# Patient Record
Sex: Female | Born: 1935 | Race: Black or African American | Hispanic: No | Marital: Single | Smoking: Never smoker
Health system: Southern US, Community
[De-identification: ages and names within clinical notes are randomized; demographics above are authoritative.]

## PROBLEM LIST (undated history)

## (undated) DIAGNOSIS — F419 Anxiety disorder, unspecified: Secondary | ICD-10-CM

## (undated) DIAGNOSIS — K219 Gastro-esophageal reflux disease without esophagitis: Secondary | ICD-10-CM

## (undated) DIAGNOSIS — M1990 Unspecified osteoarthritis, unspecified site: Secondary | ICD-10-CM

## (undated) DIAGNOSIS — I5030 Unspecified diastolic (congestive) heart failure: Secondary | ICD-10-CM

## (undated) DIAGNOSIS — I10 Essential (primary) hypertension: Secondary | ICD-10-CM

## (undated) DIAGNOSIS — E039 Hypothyroidism, unspecified: Secondary | ICD-10-CM

## (undated) DIAGNOSIS — I421 Obstructive hypertrophic cardiomyopathy: Secondary | ICD-10-CM

## (undated) DIAGNOSIS — R7303 Prediabetes: Secondary | ICD-10-CM

## (undated) DIAGNOSIS — G4730 Sleep apnea, unspecified: Secondary | ICD-10-CM

## (undated) DIAGNOSIS — E079 Disorder of thyroid, unspecified: Secondary | ICD-10-CM

## (undated) HISTORY — PX: TUBAL LIGATION: SHX77

---

## 2012-11-05 ENCOUNTER — Inpatient Hospital Stay (HOSPITAL_COMMUNITY): Payer: Medicare Other

## 2012-11-05 ENCOUNTER — Emergency Department (HOSPITAL_BASED_OUTPATIENT_CLINIC_OR_DEPARTMENT_OTHER): Payer: Medicare Other

## 2012-11-05 ENCOUNTER — Encounter (HOSPITAL_BASED_OUTPATIENT_CLINIC_OR_DEPARTMENT_OTHER): Payer: Self-pay | Admitting: *Deleted

## 2012-11-05 ENCOUNTER — Inpatient Hospital Stay (HOSPITAL_BASED_OUTPATIENT_CLINIC_OR_DEPARTMENT_OTHER)
Admission: EM | Admit: 2012-11-05 | Discharge: 2012-11-10 | DRG: 438 | Disposition: A | Discharge status: Home / Self Care | Payer: Medicare Other | Attending: Internal Medicine | Admitting: Internal Medicine

## 2012-11-05 DIAGNOSIS — R7309 Other abnormal glucose: Secondary | ICD-10-CM | POA: Diagnosis present

## 2012-11-05 DIAGNOSIS — E039 Hypothyroidism, unspecified: Secondary | ICD-10-CM

## 2012-11-05 DIAGNOSIS — I10 Essential (primary) hypertension: Secondary | ICD-10-CM

## 2012-11-05 DIAGNOSIS — E876 Hypokalemia: Secondary | ICD-10-CM | POA: Diagnosis present

## 2012-11-05 DIAGNOSIS — R6510 Systemic inflammatory response syndrome (SIRS) of non-infectious origin without acute organ dysfunction: Secondary | ICD-10-CM | POA: Diagnosis present

## 2012-11-05 DIAGNOSIS — F411 Generalized anxiety disorder: Secondary | ICD-10-CM | POA: Diagnosis present

## 2012-11-05 DIAGNOSIS — K219 Gastro-esophageal reflux disease without esophagitis: Secondary | ICD-10-CM | POA: Diagnosis present

## 2012-11-05 DIAGNOSIS — I509 Heart failure, unspecified: Secondary | ICD-10-CM

## 2012-11-05 DIAGNOSIS — I5033 Acute on chronic diastolic (congestive) heart failure: Secondary | ICD-10-CM

## 2012-11-05 DIAGNOSIS — I2510 Atherosclerotic heart disease of native coronary artery without angina pectoris: Secondary | ICD-10-CM | POA: Diagnosis present

## 2012-11-05 DIAGNOSIS — G4733 Obstructive sleep apnea (adult) (pediatric): Secondary | ICD-10-CM

## 2012-11-05 DIAGNOSIS — K859 Acute pancreatitis without necrosis or infection, unspecified: Principal | ICD-10-CM

## 2012-11-05 DIAGNOSIS — R9431 Abnormal electrocardiogram [ECG] [EKG]: Secondary | ICD-10-CM | POA: Diagnosis present

## 2012-11-05 DIAGNOSIS — I5031 Acute diastolic (congestive) heart failure: Secondary | ICD-10-CM

## 2012-11-05 DIAGNOSIS — I421 Obstructive hypertrophic cardiomyopathy: Secondary | ICD-10-CM

## 2012-11-05 DIAGNOSIS — M129 Arthropathy, unspecified: Secondary | ICD-10-CM | POA: Diagnosis present

## 2012-11-05 DIAGNOSIS — Z79899 Other long term (current) drug therapy: Secondary | ICD-10-CM

## 2012-11-05 DIAGNOSIS — Z7982 Long term (current) use of aspirin: Secondary | ICD-10-CM

## 2012-11-05 DIAGNOSIS — E8779 Other fluid overload: Secondary | ICD-10-CM | POA: Diagnosis present

## 2012-11-05 DIAGNOSIS — R1013 Epigastric pain: Secondary | ICD-10-CM

## 2012-11-05 HISTORY — DX: Unspecified osteoarthritis, unspecified site: M19.90

## 2012-11-05 HISTORY — DX: Gastro-esophageal reflux disease without esophagitis: K21.9

## 2012-11-05 HISTORY — DX: Hypothyroidism, unspecified: E03.9

## 2012-11-05 HISTORY — DX: Prediabetes: R73.03

## 2012-11-05 HISTORY — DX: Disorder of thyroid, unspecified: E07.9

## 2012-11-05 HISTORY — DX: Sleep apnea, unspecified: G47.30

## 2012-11-05 HISTORY — DX: Essential (primary) hypertension: I10

## 2012-11-05 HISTORY — DX: Anxiety disorder, unspecified: F41.9

## 2012-11-05 HISTORY — DX: Obstructive hypertrophic cardiomyopathy: I42.1

## 2012-11-05 LAB — CBC
HCT: 40.6 % (ref 36.0–46.0)
Hemoglobin: 14 g/dL (ref 12.0–15.0)
MCHC: 34.5 g/dL (ref 30.0–36.0)
MCV: 80.2 fL (ref 78.0–100.0)

## 2012-11-05 LAB — CBC WITH DIFFERENTIAL/PLATELET
Basophils Absolute: 0 10*3/uL (ref 0.0–0.1)
Eosinophils Absolute: 0 10*3/uL (ref 0.0–0.7)
Eosinophils Relative: 0 % (ref 0–5)
Lymphocytes Relative: 16 % (ref 12–46)
Lymphs Abs: 1.8 10*3/uL (ref 0.7–4.0)
MCH: 27.3 pg (ref 26.0–34.0)
MCV: 79.2 fL (ref 78.0–100.0)
Neutrophils Relative %: 79 % — ABNORMAL HIGH (ref 43–77)
Platelets: 224 10*3/uL (ref 150–400)
RBC: 5.68 MIL/uL — ABNORMAL HIGH (ref 3.87–5.11)
RDW: 14.9 % (ref 11.5–15.5)
WBC: 11.4 10*3/uL — ABNORMAL HIGH (ref 4.0–10.5)

## 2012-11-05 LAB — URINALYSIS, ROUTINE W REFLEX MICROSCOPIC
Bilirubin Urine: NEGATIVE
Glucose, UA: NEGATIVE mg/dL
Specific Gravity, Urine: 1.022 (ref 1.005–1.030)
pH: 5.5 (ref 5.0–8.0)

## 2012-11-05 LAB — COMPREHENSIVE METABOLIC PANEL
ALT: 37 U/L — ABNORMAL HIGH (ref 0–35)
AST: 49 U/L — ABNORMAL HIGH (ref 0–37)
Alkaline Phosphatase: 91 U/L (ref 39–117)
Calcium: 9.5 mg/dL (ref 8.4–10.5)
Potassium: 3.7 mEq/L (ref 3.5–5.1)
Sodium: 138 mEq/L (ref 135–145)
Total Protein: 8.1 g/dL (ref 6.0–8.3)

## 2012-11-05 LAB — CREATININE, SERUM: GFR calc non Af Amer: 55 mL/min — ABNORMAL LOW (ref >90)

## 2012-11-05 LAB — URINE MICROSCOPIC-ADD ON

## 2012-11-05 LAB — TROPONIN I
Troponin I: <0.3 ng/mL (ref <0.30)
Troponin I: <0.3 ng/mL (ref <0.30)

## 2012-11-05 MED ORDER — ONDANSETRON HCL 4 MG PO TABS: 4.0000 mg | ORAL_TABLET | Freq: Four times a day (QID) | ORAL | Status: DC | PRN

## 2012-11-05 MED ORDER — IOHEXOL 300 MG/ML  SOLN
50.0000 mL | Freq: Once | INTRAMUSCULAR | Status: AC | PRN
  Administered 2012-11-05: 50 mL via ORAL

## 2012-11-05 MED ORDER — DILTIAZEM HCL ER BEADS 180 MG PO CP24: 180.0000 mg | ORAL_CAPSULE | Freq: Every day | ORAL | Status: DC

## 2012-11-05 MED ORDER — ENOXAPARIN SODIUM 40 MG/0.4ML ~~LOC~~ SOLN
40.0000 mg | SUBCUTANEOUS | Status: DC
  Administered 2012-11-05 – 2012-11-09 (×5): 40 mg via SUBCUTANEOUS
  Filled 2012-11-05 (×6): qty 0.4

## 2012-11-05 MED ORDER — ASPIRIN 81 MG PO TABS: 81.0000 mg | ORAL_TABLET | Freq: Every day | ORAL | Status: DC

## 2012-11-05 MED ORDER — LEVOTHYROXINE SODIUM 25 MCG PO TABS
25.0000 ug | ORAL_TABLET | Freq: Every day | ORAL | Status: DC
  Administered 2012-11-06 – 2012-11-10 (×5): 25 ug via ORAL
  Filled 2012-11-05 (×6): qty 1

## 2012-11-05 MED ORDER — IOHEXOL 300 MG/ML  SOLN
100.0000 mL | Freq: Once | INTRAMUSCULAR | Status: AC | PRN
  Administered 2012-11-05: 100 mL via INTRAVENOUS

## 2012-11-05 MED ORDER — SODIUM CHLORIDE 0.9 % IV BOLUS (SEPSIS)
500.0000 mL | Freq: Once | INTRAVENOUS | Status: AC
  Administered 2012-11-05: 500 mL via INTRAVENOUS

## 2012-11-05 MED ORDER — ONDANSETRON HCL 4 MG/2ML IJ SOLN: 4.0000 mg | Freq: Three times a day (TID) | INTRAMUSCULAR | Status: DC | PRN

## 2012-11-05 MED ORDER — ALUM & MAG HYDROXIDE-SIMETH 200-200-20 MG/5ML PO SUSP: 30.0000 mL | Freq: Four times a day (QID) | ORAL | Status: DC | PRN

## 2012-11-05 MED ORDER — ONDANSETRON HCL 4 MG/2ML IJ SOLN
4.0000 mg | Freq: Once | INTRAMUSCULAR | Status: AC
  Administered 2012-11-05: 4 mg via INTRAVENOUS
  Filled 2012-11-05: qty 2

## 2012-11-05 MED ORDER — ALPRAZOLAM 0.5 MG PO TABS
0.5000 mg | ORAL_TABLET | Freq: Two times a day (BID) | ORAL | Status: DC
  Administered 2012-11-05: 0.5 mg via ORAL
  Filled 2012-11-05: qty 1

## 2012-11-05 MED ORDER — METOPROLOL SUCCINATE ER 100 MG PO TB24
100.0000 mg | ORAL_TABLET | Freq: Every day | ORAL | Status: DC
  Administered 2012-11-05: 100 mg via ORAL
  Filled 2012-11-05 (×2): qty 1

## 2012-11-05 MED ORDER — HYDROCODONE-ACETAMINOPHEN 5-325 MG PO TABS: 1.0000 | ORAL_TABLET | ORAL | Status: DC | PRN

## 2012-11-05 MED ORDER — ACETAMINOPHEN 325 MG PO TABS: 650.0000 mg | ORAL_TABLET | Freq: Four times a day (QID) | ORAL | Status: DC | PRN

## 2012-11-05 MED ORDER — DILTIAZEM HCL ER COATED BEADS 180 MG PO CP24
180.0000 mg | ORAL_CAPSULE | Freq: Every day | ORAL | Status: DC
  Administered 2012-11-05 – 2012-11-10 (×5): 180 mg via ORAL
  Filled 2012-11-05 (×7): qty 1

## 2012-11-05 MED ORDER — DICYCLOMINE HCL 10 MG PO CAPS
10.0000 mg | ORAL_CAPSULE | Freq: Two times a day (BID) | ORAL | Status: DC
  Administered 2012-11-05 – 2012-11-07 (×2): 10 mg via ORAL
  Filled 2012-11-05 (×5): qty 1

## 2012-11-05 MED ORDER — SODIUM CHLORIDE 0.9 % IV SOLN
INTRAVENOUS | Status: DC
  Administered 2012-11-05 – 2012-11-06 (×3): via INTRAVENOUS

## 2012-11-05 MED ORDER — ONDANSETRON HCL 4 MG/2ML IJ SOLN
4.0000 mg | Freq: Four times a day (QID) | INTRAMUSCULAR | Status: DC | PRN
  Administered 2012-11-05 – 2012-11-06 (×2): 4 mg via INTRAVENOUS
  Filled 2012-11-05 (×2): qty 2

## 2012-11-05 MED ORDER — ASPIRIN 81 MG PO CHEW
81.0000 mg | CHEWABLE_TABLET | Freq: Every day | ORAL | Status: DC
  Administered 2012-11-05 – 2012-11-10 (×5): 81 mg via ORAL
  Filled 2012-11-05 (×7): qty 1

## 2012-11-05 MED ORDER — PANTOPRAZOLE SODIUM 40 MG IV SOLR
40.0000 mg | Freq: Two times a day (BID) | INTRAVENOUS | Status: DC
  Administered 2012-11-05 – 2012-11-06 (×3): 40 mg via INTRAVENOUS
  Filled 2012-11-05 (×5): qty 40

## 2012-11-05 MED ORDER — ACETAMINOPHEN 650 MG RE SUPP: 650.0000 mg | Freq: Four times a day (QID) | RECTAL | Status: DC | PRN

## 2012-11-05 MED ORDER — DEXTROSE-NACL 5-0.45 % IV SOLN
INTRAVENOUS | Status: AC
  Administered 2012-11-05: 16:00:00 via INTRAVENOUS

## 2012-11-05 MED ORDER — HYDROMORPHONE HCL PF 1 MG/ML IJ SOLN
1.0000 mg | INTRAMUSCULAR | Status: DC | PRN
  Administered 2012-11-05: 1 mg via INTRAVENOUS
  Filled 2012-11-05: qty 1

## 2012-11-05 NOTE — Progress Notes (Signed)
ZORIANA OATS [id number] Admission Data: 11/05/2012 4:29 PM Attending Provider: Cathren Harsh, MD  PCP:No primary provider on file. Consults/ Treatment Team:    Elizabeth Shields is a 79 y.o. female patient admitted from [facility] with pancreatitis. awake, alert  & orientated  X 3, independent No Order, VSS - Blood pressure 136/64, pulse 108, temperature 98.6 F (37 C), temperature source Oral, resp. rate 16, height 5\' 3"  (1.6 m), weight 78.019 kg (172 lb), SpO2 93.00%., , no c/o shortness of breath, no c/o chest pain, no distress noted.  IV site WDL:  antecubital left, condition patent and no redness with a transparent dsg that's clean dry and intact.  Allergies:   Allergies  Allergen Reactions  . Sulfa Antibiotics Nausea And Vomiting     Past Medical History  Diagnosis Date  . Hypertension   . Thyroid disease   . Coronary artery disease   . Arthritis     History:  obtained from the patient.  Pt orientation to unit, room and routine. Information packet given to patient/family and safety video watched.  Admission INP armband ID verified with patient/family, and in place. SR up x 2, fall risk assessment complete with Patient and family verbalizing understanding of risks associated with falls. Pt verbalizes an understanding of how to use the call bell and to call for help before getting out of bed.  Skin, clean-dry- intact without evidence of bruising, or skin tears.   No evidence of skin break down noted on exam.     Will cont to monitor and assist as needed.  Cindra Eves, RN 11/05/2012 4:29 PM

## 2012-11-05 NOTE — H&P (Signed)
History and Physical       Hospital Admission Note Date: 11/05/2012  Patient name: Elizabeth Shields Medical record number: [id number] Date of birth: [birth year] Age: 79 y.o. Gender: female PCP: Dr Meredeth Ide in [place]    Chief Complaint:  Abdominal pain for last 2 days  HPI: Patient is a 79 year old female with history of hypertension, hypothyroidism, arthritis presented to [facility] ED with abdominal pain. History was obtained from the patient and her family present in the room. Patient reported having epigastric pain which started yesterday, radiating to right upper quadrant and left upper quadrant, 8/10, sharp with nausea. She did not have any vomiting however had to try eating and unable to hold anything down. She did have constipation but last bowel movement was today which did not improve her pain. ED workup showed lipase more than 3000, CT of the abdomen and pelvis showing possibly acute pancreatitis with apparent inflammatory process in right upper quadrant of the abdomen with ascites and soft tissue edema surrounding the proximal duodenum and right colon.   Review of Systems:  Constitutional: Denies diaphoresis,+ poor appetite and fatigue, + subjective fever and chills.  HEENT: Denies photophobia, eye pain, redness, hearing loss, ear pain, congestion, sore throat, rhinorrhea, sneezing, mouth sores, trouble swallowing, neck pain, neck stiffness and tinnitus.   Respiratory: Denies SOB, DOE, cough, chest tightness,  and wheezing.   Cardiovascular: Denies chest pain, palpitations and leg swelling.  Gastrointestinal: Please see history of present illness Genitourinary: Denies dysuria, urgency, frequency, hematuria, flank pain and difficulty urinating.  Musculoskeletal: Denies myalgias, back pain, joint swelling, arthralgias and gait problem.  Skin: Denies pallor, rash and wound.  Neurological: Denies dizziness, seizures,  syncope, weakness, light-headedness, numbness and headaches.  Hematological: Denies adenopathy. Easy bruising, personal or family bleeding history  Psychiatric/Behavioral: Denies suicidal ideation, mood changes, confusion, nervousness, sleep disturbance and agitation  Past Medical History: Past Medical History  Diagnosis Date  . Hypertension   . Thyroid disease   . Coronary artery disease   . Arthritis    History reviewed. No pertinent past surgical history.  Medications: Prior to Admission medications   Medication Sig Start Date End Date Taking? Authorizing Provider  acetaminophen (TYLENOL) 500 MG tablet Take 500 mg by mouth every 6 (six) hours as needed for pain.   Yes Historical Provider, MD  ALPRAZolam (XANAX) 0.5 MG tablet 0.5 mg 2 (two) times daily.   Yes Historical Provider, MD  aspirin 81 MG tablet Take 81 mg by mouth daily.   Yes Historical Provider, MD  Calcium Carbonate-Vitamin D (CALTRATE 600+D) 600-400 MG-UNIT per tablet Take 1 tablet by mouth 2 (two) times daily.   Yes Historical Provider, MD  dicyclomine (BENTYL) 10 MG capsule Take 10 mg by mouth 2 (two) times daily.   Yes Historical Provider, MD  diltiazem (TIAZAC) 180 MG 24 hr capsule Take 180 mg by mouth daily.   Yes Historical Provider, MD  levothyroxine (SYNTHROID, LEVOTHROID) 25 MCG tablet Take 25 mcg by mouth daily before breakfast.   Yes Historical Provider, MD  metoprolol succinate (TOPROL-XL) 100 MG 24 hr tablet Take 100 mg by mouth daily. Take with or immediately following a meal.   Yes Historical Provider, MD  Multiple Vitamins-Calcium (ONE-A-DAY WOMENS FORMULA PO) Take 1 tablet by mouth daily.   Yes Historical Provider, MD  pantoprazole (PROTONIX) 40 MG tablet Take 40 mg by mouth daily.   Yes Historical Provider, MD  pravastatin (PRAVACHOL) 10 MG tablet Take 10 mg by mouth daily.  Yes Historical Provider, MD  raloxifene (EVISTA) 60 MG tablet Take 60 mg by mouth daily.   Yes Historical Provider, MD     Allergies:   Allergies  Allergen Reactions  . Sulfa Antibiotics Nausea And Vomiting    Social History:  reports that she has never smoked. She does not have any smokeless tobacco history on file. She reports that she does not drink alcohol or use illicit drugs. she currently lives at home and is functional with ADLs  Family History: 1sister had breast cancer, one of her brothers had liver cancer  Physical Exam: Blood pressure 145/83, pulse 108, temperature 98.6 F (37 C), temperature source Oral, resp. rate 18, height 5\' 3"  (1.6 m), weight 78.019 kg (172 lb), SpO2 96.00%. General: Alert, awake, oriented x3, in no acute distress. HEENT: normocephalic, atraumatic, anicteric sclera, pink conjunctiva, pupils equal and reactive to light and accomodation, oropharynx clear Neck: supple, no masses or lymphadenopathy, no goiter, no bruits  Heart: Regular rate and rhythm, without murmurs, rubs or gallops. Lungs: Clear to auscultation bilaterally, no wheezing, rales or rhonchi. Abdomen: Soft, tender diffusely but worse in epigastric, RUQ, LUQ nondistended, positive bowel sounds. Extremities: No clubbing, cyanosis or edema with positive pedal pulses. Neuro: Grossly intact, no focal neurological deficits, strength 5/5 upper and lower extremities bilaterally Psych: alert and oriented x 3, normal mood and affect Skin: no rashes or lesions, warm and dry   LABS on Admission:  Basic Metabolic Panel:  Recent Labs Lab 11/05/12 1015  NA 138  K 3.7  CL 101  CO2 23  GLUCOSE 133*  BUN 14  CREATININE 0.90  CALCIUM 9.5   Liver Function Tests:  Recent Labs Lab 11/05/12 1015  AST 49*  ALT 37*  ALKPHOS 91  BILITOT 0.8  PROT 8.1  ALBUMIN 3.9    Recent Labs Lab 11/05/12 1015  LIPASE >3000*   No results found for this basename: AMMONIA,  in the last 168 hours CBC:  Recent Labs Lab 11/05/12 1015  WBC 11.4*  NEUTROABS 9.0*  HGB 15.5*  HCT 45.0  MCV 79.2  PLT 224    Cardiac Enzymes:  Recent Labs Lab 11/05/12 1021  TROPONINI <0.30   BNP: No components found with this basename: POCBNP,  CBG: No results found for this basename: GLUCAP,  in the last 168 hours   Radiological Exams on Admission: Ct Abdomen Pelvis W Contrast  11/05/2012  **ADDENDUM** CREATED: 11/05/2012 13:30:01  In further discussion with Dr. Rubin Payor and in light of a significantly elevated serum lipase level, these findings are probably secondary to acute pancreatitis.  **END ADDENDUM** SIGNED BY: Gerrianne Scale, M.D.   11/05/2012  *  IMPRESSION:  1.  There is an apparent inflammatory process in the right upper quadrant of the abdomen with ascites and soft tissue edema surrounding the proximal duodenum and right colon. The source for this is not clearly demonstrated and is not felt to be related to the gallbladder, pancreas or appendix. 2.  No evidence of extravasated enteric contrast, bowel obstruction or drainable abscess. 3.  No significant solid parenchymal organ findings. 4.  Small right pleural effusion and right lower lobe atelectasis.   Original Report Authenticated By: Carey Bullocks, M.D.    Dg Abd Acute W/chest  11/05/2012  *RADIOLOGY REPORT*  Clinical Data: Abdominal pain with nausea since yesterday.  ACUTE ABDOMEN SERIES (ABDOMEN 2 VIEW & CHEST 1 VIEW)  Comparison: None.  Findings: There are low lung volumes with patchy right greater than left basilar  pulmonary opacities.  No significant pleural effusion is identified.  Heart size and mediastinal contours are normal. Scattered osteophytes of the thoracic spine are noted.  The bowel gas pattern is normal.  There is no free intraperitoneal air.  There are no suspicious calcifications.  Small pelvic calcifications on the left are probably phleboliths.  There is a mild convex right lumbar scoliosis.  IMPRESSION:  1.  No acute abdominal findings identified. 2.  Low lung volumes with probable bibasilar atelectasis.   Original Report  Authenticated By: Carey Bullocks, M.D.     Assessment/Plan Principal Problem:   Pancreatitis, acute with abdominal pain epigastric, RUQ, LUQ: Unclear etiology, possibly gallstones versus medication effect (statins), no history of alcohol. LFTs mildly up. - Admit to telemetry, will obtain RUQ abdominal ultrasound, place on n.p.o. status - IV fluid hydration, pain control, antiemetics, IV protonix - CMET, lipase in AM - hold statins, no prior episode of pancreatitis before.  Active Problems:   HTN (hypertension) - Continue Cardizem, beta blocker   Nonspecific changes on the EKG: rate 99, NSR, LVH with repolarization changes vs ST depressions. No prior EKG to compare with. Tachycardia could be due to acute pancreatitis or due to BB/CCB withdrawal as patient has not taken any of her long acting antihypertensives in last 24hours. She does not have any chest pain or shortness of breath.  - Placed on telemetry, rule out acute ACS,  First set of troponins negative, check ECHO - continue aspirin, beta blocker, CCB   Hypothyroidism: Continue Synthroid, check TSH  DVT prophylaxis: Lovenox  CODE STATUS: Full code (addressed with the patient)  Further plan will depend as patient's clinical course evolves and further radiologic and laboratory data become available.   Time Spent on Admission: 1 hour  Yue Flanigan M.D. [facility] 11/05/2012, 4:48 PM Pager: [phone]  If 7PM-7AM, please contact night-coverage [email or web address] Password [email or web address]

## 2012-11-05 NOTE — ED Notes (Signed)
Abdominal pain and nausea onset yesterday states stomach is sore from "gagging"

## 2012-11-05 NOTE — ED Notes (Signed)
Patient transported to & from xray. 

## 2012-11-05 NOTE — Progress Notes (Signed)
Received call from DR Rubin Payor to accept for admission Elizabeth Shields for direct admission. Patient presents with nausea, abdominal pain. Lipase more than 3000. No history of alcohol. CT show inflammation RUQ.Marland Kitchen Please see full report. Patient per Dr Rubin Payor stable for transfer. Regular bed.  Abshir Paolini. Md.

## 2012-11-05 NOTE — ED Provider Notes (Signed)
History     CSN: [id number]  Arrival date & time 11/05/12  0907   First MD Initiated Contact with Patient 11/05/12 0935      Chief Complaint  Patient presents with  . Abdominal Pain    (Consider location/radiation/quality/duration/timing/severity/associated sxs/prior treatment) Patient is a 79 y.o. female presenting with abdominal pain. The history is provided by the patient.  Abdominal Pain Associated symptoms: fatigue and nausea   Associated symptoms: no chest pain, no diarrhea, no shortness of breath and no vomiting    Patient's had upper abdominal pain that then went to generalized abdominal pain. Again a couple days ago. She's had nausea without vomiting. She has had constipation for the last 2 days. No fevers. She's had a decreased appetite. No chest pain. No cough. No dysuria. She's had no previous abdominal surgery. States she feels bad all over. No sore throat. No headache. No Confusion. No clear sick contacts. The pain is dull and constant. There is some cramping. Past Medical History  Diagnosis Date  . Hypertension   . Thyroid disease   . Coronary artery disease   . Arthritis     History reviewed. No pertinent past surgical history.  History reviewed. No pertinent family history.  History  Substance Use Topics  . Smoking status: Never Smoker   . Smokeless tobacco: Not on file  . Alcohol Use: No    OB History   Grav Para Term Preterm Abortions TAB SAB Ect Mult Living                  Review of Systems  Constitutional: Positive for appetite change and fatigue. Negative for activity change.  HENT: Negative for neck stiffness.   Eyes: Negative for pain.  Respiratory: Negative for chest tightness and shortness of breath.   Cardiovascular: Negative for chest pain and leg swelling.  Gastrointestinal: Positive for nausea and abdominal pain. Negative for vomiting and diarrhea.  Genitourinary: Negative for flank pain.  Musculoskeletal: Negative for back pain.   Skin: Negative for rash.  Neurological: Negative for weakness, numbness and headaches.  Psychiatric/Behavioral: Negative for behavioral problems.    Allergies  Sulfa antibiotics  Home Medications   Current Outpatient Rx  Name  Route  Sig  Dispense  Refill  . acetaminophen (TYLENOL) 500 MG tablet   Oral   Take 500 mg by mouth every 6 (six) hours as needed for pain.         Marland Kitchen ALPRAZolam (XANAX) 0.5 MG tablet      0.5 mg 2 (two) times daily.         Marland Kitchen aspirin 81 MG tablet   Oral   Take 81 mg by mouth daily.         . Calcium Carbonate-Vitamin D (CALTRATE 600+D) 600-400 MG-UNIT per tablet   Oral   Take 1 tablet by mouth 2 (two) times daily.         Marland Kitchen dicyclomine (BENTYL) 10 MG capsule   Oral   Take 10 mg by mouth 2 (two) times daily.         Marland Kitchen diltiazem (TIAZAC) 180 MG 24 hr capsule   Oral   Take 180 mg by mouth daily.         Marland Kitchen levothyroxine (SYNTHROID, LEVOTHROID) 25 MCG tablet   Oral   Take 25 mcg by mouth daily before breakfast.         . metoprolol succinate (TOPROL-XL) 100 MG 24 hr tablet   Oral   Take 100  mg by mouth daily. Take with or immediately following a meal.         . Multiple Vitamins-Calcium (ONE-A-DAY WOMENS FORMULA PO)   Oral   Take 1 tablet by mouth daily.         . pantoprazole (PROTONIX) 40 MG tablet   Oral   Take 40 mg by mouth daily.         . pravastatin (PRAVACHOL) 10 MG tablet   Oral   Take 10 mg by mouth daily.         . raloxifene (EVISTA) 60 MG tablet   Oral   Take 60 mg by mouth daily.           BP 136/64  Pulse 108  Temp(Src) 98.6 F (37 C) (Oral)  Resp 16  Ht 5\' 3"  (1.6 m)  Wt 172 lb (78.019 kg)  BMI 30.48 kg/m2  SpO2 93%  Physical Exam  Nursing note and vitals reviewed. Constitutional: She is oriented to person, place, and time. She appears well-developed and well-nourished.  HENT:  Head: Normocephalic and atraumatic.  Eyes: EOM are normal. Pupils are equal, round, and reactive to  light.  Neck: Normal range of motion. Neck supple.  Cardiovascular: Regular rhythm and normal heart sounds.   No murmur heard. Mild tachycardia  Pulmonary/Chest: Effort normal and breath sounds normal. No respiratory distress. She has no wheezes. She has no rales.  Abdominal: Soft. Bowel sounds are normal. She exhibits no distension. There is tenderness. There is no rebound and no guarding.  Mild diffuse tenderness without rebound or guarding. No palpated masses  Musculoskeletal: Normal range of motion.  Neurological: She is alert and oriented to person, place, and time. No cranial nerve deficit.  Skin: Skin is warm and dry.  Psychiatric: She has a normal mood and affect. Her speech is normal.    ED Course  Procedures (including critical care time)  Labs Reviewed  CBC WITH DIFFERENTIAL - Abnormal; Notable for the following:    WBC 11.4 (*)    RBC 5.68 (*)    Hemoglobin 15.5 (*)    Neutrophils Relative 79 (*)    Neutro Abs 9.0 (*)    All other components within normal limits  COMPREHENSIVE METABOLIC PANEL - Abnormal; Notable for the following:    Glucose, Bld 133 (*)    AST 49 (*)    ALT 37 (*)    GFR calc non Af Amer 60 (*)    GFR calc Af Amer 69 (*)    All other components within normal limits  LIPASE, BLOOD - Abnormal; Notable for the following:    Lipase >3000 (*)    All other components within normal limits  URINALYSIS, ROUTINE W REFLEX MICROSCOPIC - Abnormal; Notable for the following:    Color, Urine AMBER (*)    Hgb urine dipstick TRACE (*)    Ketones, ur 15 (*)    Protein, ur 30 (*)    All other components within normal limits  URINE MICROSCOPIC-ADD ON - Abnormal; Notable for the following:    Bacteria, UA FEW (*)    Casts GRANULAR CAST (*)    All other components within normal limits  TROPONIN I   Ct Abdomen Pelvis W Contrast  11/05/2012  **ADDENDUM** CREATED: 11/05/2012 13:30:01  In further discussion with Dr. Rubin Payor and in light of a significantly elevated  serum lipase level, these findings are probably secondary to acute pancreatitis.  **END ADDENDUM** SIGNED BY: Gerrianne Scale, M.D.   11/05/2012  *  RADIOLOGY REPORT*  Clinical Data: Diffuse abdominal pain with nausea and slight diarrhea.  CT ABDOMEN AND PELVIS WITH CONTRAST  Technique:  Multidetector CT imaging of the abdomen and pelvis was performed following the standard protocol during bolus administration of intravenous contrast.  Contrast: 50mL OMNIPAQUE IOHEXOL 300 MG/ML  SOLN, OMNIPAQUE IOHEXOL 300 MG/ML  SOLN  Comparison: Acute abdominal series same date.  Findings: There is a small right pleural effusion with associated right lower lobe atelectasis.  The lung bases are otherwise clear. Faint coronary calcifications are noted.  There is a moderate amount of perihepatic ascites.  Fluid tracks along the right paracolic gutter into the pelvis.  There is minimal fluid surrounding the gallbladder, and no significant gallbladder wall thickening.  Edema surrounds the proximal duodenum.  There is no duodenal or colonic wall thickening.  There is no extravasated enteric contrast.  The pancreas demonstrates normal density and enhancement.  There is no peripancreatic fluid collection or biliary dilatation.  There are several well circumscribed low density hepatic lesions most consistent with cysts.  The largest of these is in the lateral segment left hepatic lobe, measuring 2.5 cm on image 19.  The spleen and adrenal glands appear normal.  There are tiny low density right renal lesions.  Although there is mildly asymmetric perinephric soft tissue stranding on the right, no primary renal process is identified.  The stomach is incompletely distended.  The small bowel and colon within the pelvis appear normal.  The cecum is situated within the right mid abdomen.  The appendix demonstrates no definite inflammation.  There is mild aorto iliac atherosclerosis.  The bladder, uterus and ovaries appear normal.  The lower  lumbar spine degenerative changes are present with a grade 1 anterolisthesis at L3-L4 and L4-L5.  No acute osseous findings are seen.  IMPRESSION:  1.  There is an apparent inflammatory process in the right upper quadrant of the abdomen with ascites and soft tissue edema surrounding the proximal duodenum and right colon. The source for this is not clearly demonstrated and is not felt to be related to the gallbladder, pancreas or appendix. 2.  No evidence of extravasated enteric contrast, bowel obstruction or drainable abscess. 3.  No significant solid parenchymal organ findings. 4.  Small right pleural effusion and right lower lobe atelectasis.   Original Report Authenticated By: Carey Bullocks, M.D.    Dg Abd Acute W/chest  11/05/2012  *RADIOLOGY REPORT*  Clinical Data: Abdominal pain with nausea since yesterday.  ACUTE ABDOMEN SERIES (ABDOMEN 2 VIEW & CHEST 1 VIEW)  Comparison: None.  Findings: There are low lung volumes with patchy right greater than left basilar pulmonary opacities.  No significant pleural effusion is identified.  Heart size and mediastinal contours are normal. Scattered osteophytes of the thoracic spine are noted.  The bowel gas pattern is normal.  There is no free intraperitoneal air.  There are no suspicious calcifications.  Small pelvic calcifications on the left are probably phleboliths.  There is a mild convex right lumbar scoliosis.  IMPRESSION:  1.  No acute abdominal findings identified. 2.  Low lung volumes with probable bibasilar atelectasis.   Original Report Authenticated By: Carey Bullocks, M.D.      1. Pancreatitis     Date: 11/05/2012  Rate: 78Rhythm: normal sinus rhythm  QRS Axis: normal  Intervals: normal  ST/T Wave abnormalities: nonspecific ST/T changes  Conduction Disutrbances:none  Narrative Interpretation: Nonspecific ST and T wave changes. LVH with likely repol  Old EKG Reviewed: none  available    MDM  Patient presents with nausea vomiting abdominal  pain. Lab work shows a pancreatitis. CT done and shows likely pancreatitis. Patient be admitted to internal medicine        Juliet Rude. Rubin Payor, MD 11/05/12 [phone]

## 2012-11-05 NOTE — Progress Notes (Deleted)
CRITICAL VALUE ALERT  Critical value received:  Positive blood culture Aerobic Yeast  Date of notification:  11/05/2012  Time of notification: 2205  Critical value read back:yes  Nurse who received alert:  Marcelyn Bruins  MD notified (1st page):  Jacobo Forest  Time of first page:  2206  Responding MD:  Jacobo Forest  Time MD responded:  On flood during time of call  Bascom Levels RN BSN

## 2012-11-06 ENCOUNTER — Inpatient Hospital Stay (HOSPITAL_COMMUNITY): Payer: Medicare Other

## 2012-11-06 DIAGNOSIS — E039 Hypothyroidism, unspecified: Secondary | ICD-10-CM | POA: Diagnosis present

## 2012-11-06 LAB — COMPREHENSIVE METABOLIC PANEL
CO2: 22 mEq/L (ref 19–32)
Calcium: 7.7 mg/dL — ABNORMAL LOW (ref 8.4–10.5)
Creatinine, Ser: 1.36 mg/dL — ABNORMAL HIGH (ref 0.50–1.10)
GFR calc Af Amer: 42 mL/min — ABNORMAL LOW (ref >90)
GFR calc non Af Amer: 36 mL/min — ABNORMAL LOW (ref >90)
Glucose, Bld: 97 mg/dL (ref 70–99)

## 2012-11-06 LAB — CBC
Hemoglobin: 12.6 g/dL (ref 12.0–15.0)
MCH: 27.3 pg (ref 26.0–34.0)
MCV: 80.5 fL (ref 78.0–100.0)
Platelets: 200 10*3/uL (ref 150–400)
RBC: 4.62 MIL/uL (ref 3.87–5.11)

## 2012-11-06 LAB — TSH: TSH: 5.204 u[IU]/mL — ABNORMAL HIGH (ref 0.350–4.500)

## 2012-11-06 LAB — LIPID PANEL: Cholesterol: 134 mg/dL (ref 0–200)

## 2012-11-06 MED ORDER — IMIPENEM-CILASTATIN 500 MG IV SOLR: 500.0000 mg | Freq: Three times a day (TID) | INTRAVENOUS | Status: DC

## 2012-11-06 MED ORDER — SODIUM CHLORIDE 0.9 % IV SOLN
INTRAVENOUS | Status: DC
  Administered 2012-11-06: 17:00:00 via INTRAVENOUS

## 2012-11-06 MED ORDER — HYDROMORPHONE HCL PF 1 MG/ML IJ SOLN
1.0000 mg | INTRAMUSCULAR | Status: DC | PRN
  Administered 2012-11-09: 1 mg via INTRAVENOUS
  Filled 2012-11-06: qty 1

## 2012-11-06 MED ORDER — LORAZEPAM 2 MG/ML IJ SOLN
0.5000 mg | Freq: Three times a day (TID) | INTRAMUSCULAR | Status: DC | PRN
  Administered 2012-11-07: 0.5 mg via INTRAVENOUS
  Filled 2012-11-06: qty 1

## 2012-11-06 MED ORDER — SODIUM CHLORIDE 0.9 % IV SOLN
INTRAVENOUS | Status: DC
  Administered 2012-11-06 (×2): via INTRAVENOUS

## 2012-11-06 MED ORDER — SODIUM CHLORIDE 0.9 % IV BOLUS (SEPSIS)
500.0000 mL | Freq: Once | INTRAVENOUS | Status: AC
  Administered 2012-11-06: 500 mL via INTRAVENOUS

## 2012-11-06 MED ORDER — METOPROLOL SUCCINATE ER 100 MG PO TB24
100.0000 mg | ORAL_TABLET | Freq: Every day | ORAL | Status: DC
  Administered 2012-11-07 – 2012-11-10 (×4): 100 mg via ORAL
  Filled 2012-11-06 (×5): qty 1

## 2012-11-06 NOTE — Progress Notes (Signed)
-   SIRS is due to pancreatitis. - Monitor fever curve.  Patient sitting in bed in no acute distress. Seems to be improving clinically  - CT abdomen did not show necrosis. - Cont NPO. IV fluids and narcotics and narcotics.

## 2012-11-06 NOTE — Progress Notes (Signed)
Addendum  3:13 pm  CXR shows pleural effusions and overload.  Fluids decreased to 50 ml/hr.  Can not give lasix due to pancreatitis.  Echo shows EF of 65-70 with grade 1 diastolic dysfunction. 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  Patient appears very stable.  I would have a low threshold to transfer this patient to stepdown if she decompensates what so ever.  High probability of SIRS.  Algis Downs, PA-C [facility] Pager: [phone]

## 2012-11-06 NOTE — Progress Notes (Signed)
  Echocardiogram 2D Echocardiogram has been performed.  Elizabeth Shields 11/06/2012, 11:24 AM

## 2012-11-06 NOTE — Progress Notes (Signed)
[facility] PROGRESS NOTE  Elizabeth Shields UJW:[id number] DOB: [birth year] DOA: 11/05/2012 PCP: No primary provider on file.  Patient is a 79 year old female with history of hypertension, hypothyroidism, arthritis presented to [facility] ED with abdominal pain. History was obtained from the patient and her family present in the room. Patient reported having epigastric pain which started yesterday, radiating to right upper quadrant and left upper quadrant, 8/10, sharp with nausea. She did not have any vomiting however had to try eating and unable to hold anything down. She did have constipation but last bowel movement was today which did not improve her pain.  ED workup showed lipase more than 3000, CT of the abdomen and pelvis showing possibly acute pancreatitis with apparent inflammatory process in right upper quadrant of the abdomen with ascites and soft tissue edema surrounding the proximal duodenum and right colon.   Assessment/Plan:  Concern for SIRS secondary to acute pancreatitis WBC 14.3, fever 101.2 Monitor closely - draw blood cultures if fever spikes again.  Not currently on antibiotics.  Acute pancreatitis Uncertain etiology:  No gallstones, no alcohol, Lipids are normal, calcium not elevated, no new meds. (? If statis would cause pancreatitis)  May need MRCP in a few days to rule out pancreas divisum. Possible low flow state (from hypotension). -NPO except ice chips -Dilaudid for pain (continuous pulse ox)  Hypertension -Diltiazem 180 mg q day -Toprol xl 100 mg q day  Hypothyroidism -Started Synthroid approximately 6-9 months ago. -On synthroid 25 mcg  Concern for EKG changes ?ST depression Troponins x 3 negative Repeat EKG. Echo result pending  Unsteady Gait Acute PT eval requested.    DVT Prophylaxis:  lovenox  Code Status: full Family Communication: Son Alinda Money at bedside Disposition Plan: Inpatient.  PT eval  ordered.   Consultants:  Procedures:  Echo result pending  Antibiotics:    HPI/Subjective: Reports vomiting x 2 days.  1  Loose stool recently.  Abdominal pain improved in the hospital.  No family history of pancreatitis or pancreatic cancer.  Parents died from heart disease.  Objective: Filed Vitals:   11/05/12 2240 11/06/12 0547 11/06/12 0739 11/06/12 1025  BP: 120/48 117/71  128/68  Pulse: 78 88  84  Temp: 98.6 F (37 C) 101.2 F (38.4 C) 99.6 F (37.6 C) 99.7 F (37.6 C)  TempSrc: Oral Oral Oral Oral  Resp: 16 18  18   Height:      Weight:      SpO2: 99% 92%  93%    Intake/Output Summary (Last 24 hours) at 11/06/12 1109 Last data filed at 11/05/12 1816  Gross per 24 hour  Intake 1008.33 ml  Output    276 ml  Net 732.33 ml   Filed Weights   11/05/12 0919  Weight: 78.019 kg (172 lb)    Exam:   General:  A&O, NAD, Lying comfortably in bed, slightly diaphoretic  Cardiovascular: RRR, Benign systolic murmur 2/6 , rubs or gallops, no lower extremity edema  Respiratory: CTA, no wheeze, crackles, or rales.  No increased work of breathing. Decreased breath sounds at the bases.  Abdomen: Soft, diffusely tender to palpation particularly in the epigastrum, non-distended, + bowel sounds, no masses  Musculoskeletal: Able to move all 4 extremities, 5/5 strength in each  Data Reviewed: Basic Metabolic Panel:  Recent Labs Lab 11/05/12 1015 11/05/12 1736 11/06/12 0530  NA 138  --  140  K 3.7  --  4.1  CL 101  --  108  CO2 23  --  22  GLUCOSE 133*  --  97  BUN 14  --  20  CREATININE 0.90 0.96 1.36*  CALCIUM 9.5  --  7.7*   Liver Function Tests:  Recent Labs Lab 11/05/12 1015 11/06/12 0530  AST 49* 34  ALT 37* 25  ALKPHOS 91 68  BILITOT 0.8 0.6  PROT 8.1 6.3  ALBUMIN 3.9 3.0*    Recent Labs Lab 11/05/12 1015 11/06/12 0530  LIPASE >3000* 2395*    CBC:  Recent Labs Lab 11/05/12 1015 11/05/12 1736 11/06/12 0530  WBC 11.4* 11.7* 14.3*   NEUTROABS 9.0*  --   --   HGB 15.5* 14.0 12.6  HCT 45.0 40.6 37.2  MCV 79.2 80.2 80.5  PLT 224 140* 200   Cardiac Enzymes:  Recent Labs Lab 11/05/12 1021 11/05/12 1737 11/05/12 2247 11/06/12 0530  TROPONINI <0.30 <0.30 <0.30 <0.30     Studies: US Abdomen Complete  11/05/2012  *RADIOLOGY REPORT*  Clinical Data:  Acute pancreatitis.  Assess for underlying gallstones or cholecystitis.  ABDOMINAL ULTRASOUND COMPLETE  Comparison:  CT of the abdomen and pelvis performed earlier today at 01:01 p.m.  Findings:  Gallbladder:  The gallbladder appears mildly distended.  It demonstrates multiple folds, with some degree of tortuosity, but is otherwise unremarkable in appearance.  No gallbladder wall thickening or pericholecystic fluid is seen.  No ultrasonographic Murphy's sign is elicited.  Common Bile Duct:  0.8 cm in diameter; within normal limits in caliber, given the patient's age.  Liver:  Normal parenchymal echogenicity and echotexture; apparent 2.2 x 2.1 x 1.2 cm hypoechoic cyst is again noted at the left hepatic lobe.  Limited Doppler evaluation demonstrates normal blood flow within the liver.  A small amount of ascites is noted about the liver and spleen, as on the recent CT.  IVC:  Unremarkable in appearance.  Pancreas: The patient's known mild pancreatitis is not well assessed on ultrasound. The common bile duct measures 0.8 cm at the head of the pancreas, borderline normal in size.  Spleen:  7.1 cm in length; within normal limits in size and echotexture.  Right kidney:  9.4 cm in length; normal in size, configuration and parenchymal echogenicity.  No evidence of mass or hydronephrosis. Mild fetal lobulations are suggested.  Left kidney:  9.0 cm in length; normal in size, configuration and parenchymal echogenicity.  No evidence of mass or hydronephrosis. Mild fetal lobulations are suggested.  Abdominal Aorta:  Normal in caliber; no aneurysm identified.  Not visualized at the level of the  bifurcation due to overlying bowel gas.  IMPRESSION:  1.  Known mild pancreatitis not well assessed on ultrasound. 2.  Mild apparent gallbladder distension; the gallbladder is somewhat tortuous but otherwise unremarkable in appearance.  No evidence for obstruction or cholecystitis.  Common hepatic duct and common bile duct remain borderline normal in size for the patient's age.  No gallstones identified. 3.  Left hepatic cyst again noted. 4.  Small amount of ascites noted about the liver and spleen, as on the recent CT.   Original Report Authenticated By: Tonia Ghent, M.D.    Ct Abdomen Pelvis W Contrast  11/05/2012  **ADDENDUM** CREATED: 11/05/2012 13:30:01  In further discussion with Dr. Rubin Payor and in light of a significantly elevated serum lipase level, these findings are probably secondary to acute pancreatitis.  **END ADDENDUM** SIGNED BY: Gerrianne Scale, M.D.   11/05/2012  *RADIOLOGY REPORT*  Clinical Data: Diffuse abdominal pain with nausea and slight diarrhea.  CT ABDOMEN AND PELVIS WITH  CONTRAST  Technique:  Multidetector CT imaging of the abdomen and pelvis was performed following the standard protocol during bolus administration of intravenous contrast.  Contrast: 50mL OMNIPAQUE IOHEXOL 300 MG/ML  SOLN, OMNIPAQUE IOHEXOL 300 MG/ML  SOLN  Comparison: Acute abdominal series same date.  Findings: There is a small right pleural effusion with associated right lower lobe atelectasis.  The lung bases are otherwise clear. Faint coronary calcifications are noted.  There is a moderate amount of perihepatic ascites.  Fluid tracks along the right paracolic gutter into the pelvis.  There is minimal fluid surrounding the gallbladder, and no significant gallbladder wall thickening.  Edema surrounds the proximal duodenum.  There is no duodenal or colonic wall thickening.  There is no extravasated enteric contrast.  The pancreas demonstrates normal density and enhancement.  There is no peripancreatic fluid  collection or biliary dilatation.  There are several well circumscribed low density hepatic lesions most consistent with cysts.  The largest of these is in the lateral segment left hepatic lobe, measuring 2.5 cm on image 19.  The spleen and adrenal glands appear normal.  There are tiny low density right renal lesions.  Although there is mildly asymmetric perinephric soft tissue stranding on the right, no primary renal process is identified.  The stomach is incompletely distended.  The small bowel and colon within the pelvis appear normal.  The cecum is situated within the right mid abdomen.  The appendix demonstrates no definite inflammation.  There is mild aorto iliac atherosclerosis.  The bladder, uterus and ovaries appear normal.  The lower lumbar spine degenerative changes are present with a grade 1 anterolisthesis at L3-L4 and L4-L5.  No acute osseous findings are seen.  IMPRESSION:  1.  There is an apparent inflammatory process in the right upper quadrant of the abdomen with ascites and soft tissue edema surrounding the proximal duodenum and right colon. The source for this is not clearly demonstrated and is not felt to be related to the gallbladder, pancreas or appendix. 2.  No evidence of extravasated enteric contrast, bowel obstruction or drainable abscess. 3.  No significant solid parenchymal organ findings. 4.  Small right pleural effusion and right lower lobe atelectasis.   Original Report Authenticated By: Carey Bullocks, M.D.    Dg Abd Acute W/chest  11/05/2012  *RADIOLOGY REPORT*  Clinical Data: Abdominal pain with nausea since yesterday.  ACUTE ABDOMEN SERIES (ABDOMEN 2 VIEW & CHEST 1 VIEW)  Comparison: None.  Findings: There are low lung volumes with patchy right greater than left basilar pulmonary opacities.  No significant pleural effusion is identified.  Heart size and mediastinal contours are normal. Scattered osteophytes of the thoracic spine are noted.  The bowel gas pattern is normal.   There is no free intraperitoneal air.  There are no suspicious calcifications.  Small pelvic calcifications on the left are probably phleboliths.  There is a mild convex right lumbar scoliosis.  IMPRESSION:  1.  No acute abdominal findings identified. 2.  Low lung volumes with probable bibasilar atelectasis.   Original Report Authenticated By: Carey Bullocks, M.D.     Scheduled Meds: . aspirin  81 mg Oral Daily  . dicyclomine  10 mg Oral BID  . diltiazem  180 mg Oral Daily  . enoxaparin (LOVENOX) injection  40 mg Subcutaneous Q24H  . levothyroxine  25 mcg Oral QAC breakfast  . metoprolol succinate  100 mg Oral Daily  . pantoprazole (PROTONIX) IV  40 mg Intravenous Q12H   Continuous Infusions: .  sodium chloride 150 mL/hr at 11/06/12 2130    Principal Problem:   Pancreatitis, acute Active Problems:   HTN (hypertension)   Abdominal pain, epigastric, RUQ, LUQ   Nonspecific changes on EKG    Stephani Police  [facility] Pager [phone]. If 7PM-7AM, please contact night-coverage at [email or web address], password [facility] 11/06/2012, 11:09 AM  LOS: 1 day

## 2012-11-06 NOTE — Care Management Note (Unsigned)
    Page 1 of 2   11/09/2012     10:23:00 AM   CARE MANAGEMENT NOTE 11/09/2012  Patient:  [facility] A   Account Number:  [id number]  Date Initiated:  11/05/2012  Documentation initiated by:  Letha Cape  Subjective/Objective Assessment:   dx pancreatitis  admit- lives alone, pta indep.     Action/Plan:   pt eval- rec hhpt  advancing diet   Anticipated DC Date:  11/10/2012   Anticipated DC Plan:  HOME W HOME HEALTH SERVICES      DC Planning Services  CM consult      [facility] Choice  HOME HEALTH   Choice offered to / List presented to:  C-1 Patient   DME arranged  BIPAP      DME agency  [facility]     HH arranged  HH-2 PT      [facility] agency  [facility]   Status of service:  In process, will continue to follow Medicare Important Message given?   (If response is "NO", the following Medicare IM given date fields will be blank) Date Medicare IM given:   Date Additional Medicare IM given:    Discharge Disposition:    Per UR Regulation:  Reviewed for med. necessity/level of care/duration of stay  If discussed at Long Length of Stay Meetings, dates discussed:    Comments:  11/09/12 10:15 Letha Cape RN, BSN [phone] patient is for possible dc tomorrow, informed patient and her daughter that the bipap is whats rec from the sleep study so we would not feel comfortable sending her home with something orther than the recs and also insurance will probably not pay for a cpap if it is not recommended, patient states she understands she will go home with the bipap, she will also need home oxygen.  Informed Jill Alexanders, he is awaiting orders for bipap and oxygen.  11/08/12 14:27 Letha Cape RN, BSN [phone] patient will be for possible dc tomorrow, still advancing diet, and recieving iv lasix.  Per Jill Alexanders the paper work sent for her sleep study shows she did not tolerate the cpap but she tolerated the bipap and the settings are for a bipap, informed PA.   If the face mask is what patient is uncomfortable with then we could fix this where it could be more comfortable for patient.  11/07/12 11:37 Letha Cape RN, BSN [phone] spoke with patient , offered choice, she chose [facility], referral made to [facility], Lupita Leash notified for HHPT.  Soc will begin 24-48 hrs post discharge.  Patient's daughter, Vikki Ports Person [phone] states patient had a sleep study done and patient would like to get a cpap, she will have them fax the informaton to me , I will give this to [place] with [facility], this information will have the setting information on it as well.  Informed Maryann , that we need order for the cpap with setting as well.  Awaiting fax.  11/06/12 16:12 Letha Cape RN, BSN [phone] patient lives alone in [place], but spends most of time with daughter in [place].  Await pt eval.

## 2012-11-07 ENCOUNTER — Encounter (HOSPITAL_COMMUNITY): Payer: Self-pay | Admitting: Nurse Practitioner

## 2012-11-07 DIAGNOSIS — I509 Heart failure, unspecified: Secondary | ICD-10-CM

## 2012-11-07 DIAGNOSIS — I421 Obstructive hypertrophic cardiomyopathy: Secondary | ICD-10-CM

## 2012-11-07 DIAGNOSIS — E039 Hypothyroidism, unspecified: Secondary | ICD-10-CM

## 2012-11-07 LAB — BASIC METABOLIC PANEL
BUN: 18 mg/dL (ref 6–23)
CO2: 20 mEq/L (ref 19–32)
Calcium: 7.8 mg/dL — ABNORMAL LOW (ref 8.4–10.5)
Chloride: 112 mEq/L (ref 96–112)
Creatinine, Ser: 0.96 mg/dL (ref 0.50–1.10)
GFR calc Af Amer: 64 mL/min — ABNORMAL LOW (ref >90)

## 2012-11-07 LAB — CBC
HCT: 33.9 % — ABNORMAL LOW (ref 36.0–46.0)
MCH: 27.5 pg (ref 26.0–34.0)
MCV: 81.1 fL (ref 78.0–100.0)
Platelets: 181 10*3/uL (ref 150–400)
RDW: 15.4 % (ref 11.5–15.5)
WBC: 10.9 10*3/uL — ABNORMAL HIGH (ref 4.0–10.5)

## 2012-11-07 MED ORDER — FUROSEMIDE 10 MG/ML IJ SOLN
20.0000 mg | Freq: Once | INTRAMUSCULAR | Status: AC
  Administered 2012-11-07: 20 mg via INTRAVENOUS
  Filled 2012-11-07: qty 2

## 2012-11-07 MED ORDER — PANTOPRAZOLE SODIUM 40 MG PO TBEC
40.0000 mg | DELAYED_RELEASE_TABLET | Freq: Two times a day (BID) | ORAL | Status: DC
  Administered 2012-11-07 – 2012-11-10 (×7): 40 mg via ORAL
  Filled 2012-11-07 (×7): qty 1

## 2012-11-07 NOTE — Consult Note (Signed)
CARDIOLOGY CONSULT NOTE  Patient ID: SERRIA SLOMA MRN: [id number], DOB/AGE: 02-Oct-1931   Admit date: 11/05/2012 Date of Consult: 11/07/2012   Primary Physician: No primary provider on file. Primary Cardiologist: Dr. Gerrianne Scale - [place], [place] [facility])  Pt. Profile:   [name] is a 79 year old with a history of HOCM, CAD, and HTN who presented on 11/05/12 with abdominal pain, N/V, was diagnosed with acute pancreatitis.  We've been asked to assist with volume mgmt in setting of HOCM.  Problem List  Past Medical History  Diagnosis Date  . Hypertension   . Thyroid disease   . Arthritis   . Hypothyroidism   . Anxiety   . Sleep apnea   . Borderline diabetes   . GERD (gastroesophageal reflux disease)   . HOCM (hypertrophic obstructive cardiomyopathy)     a. Dx 2009-12-26 by Dr. Launa [facility];  b. 10/2012 Echo: EF 65-70% w/ dynamic obstruction @ rest in the outflow tract w/ peak velocity of 500 cm/sec & peak gradient of , nl wall motion, Gr 1 DD, sev SAM of the ant leaflet, mild to mod MR,pasp .     Past Surgical History  Procedure Laterality Date  . Tubal ligation      Allergies  Allergies  Allergen Reactions  . Sulfa Antibiotics Nausea And Vomiting    HPI:  Mrs Elizabeth Shields is a 79 year old with a history listed above who presented on 11/05/12 with epigastric pain, nausea, and vomiting.  CT scan showed acute pancreatitis. Her ECG showed nonspecific changes resulting in an echo order and she was found to have HOCM.  We have been asked to consult for fluid status management.  With IV fluids and pain mgmt, she has had symptomatic improvement related to pancreatitis and WBC is decreasing, while fever curve is trending down.  [name] does report being diagnosed with a heart murmur as a child but never had any issues with it.  She was able to participate in sports and regular activities without any problems.   She does have a fairly large family history of heart problems in her family.  She is unable to give specific diagnoses but both her parents and 4 siblings have all had heart issues and 2 siblings have died suddenly.  Following the death of a brother in 26-Dec-2009, she saw a cardiologist in [place] (Raquel James - [facility]) and after an echo and stress test, she was diagnosed with HOCM.  She has been managed medically with bb/ccb and generally does well without significant doe, though she does experience occasional exertional chest pressure when going up inclines/steps.   She denies palpitations, dyspnea, pnd, orthopnea, n, v, dizziness, syncope, edema, weight gain, or early satiety.  Inpatient Medications  . aspirin  81 mg Oral Daily  . diltiazem  180 mg Oral Daily  . enoxaparin (LOVENOX) injection  40 mg Subcutaneous Q24H  . furosemide  20 mg Intravenous Once  . levothyroxine  25 mcg Oral QAC breakfast  . metoprolol succinate  100 mg Oral Daily  . pantoprazole  40 mg Oral BID    Family History Family History  Problem Relation Age of Onset  . Heart disease      mother, father, 4 siblings - pt unaware of details    Social History History   Social History  . Marital Status: Single    Spouse Name: N/A  Number of Children: N/A  . Years of Education: N/A   Occupational History  . Not on file.   Social History Main Topics  . Smoking status: Never Smoker   . Smokeless tobacco: Never Used  . Alcohol Use: No  . Drug Use: No  . Sexually Active: No   Other Topics Concern  . Not on file   Social History Narrative  . No narrative on file    Review of Systems  General:  No chills, fever, night sweats or weight changes.  Cardiovascular:  Positive for occasional dyspnea and palpatations on exertion along with chest pressure with inclines.  No edema, orthopnea,  paroxysmal nocturnal dyspnea. Dermatological: No rash, lesions/masses Respiratory: No cough, dyspnea Urologic: No  hematuria, dysuria Abdominal:   Positive for abdominal discomfort, nausea, vomiting, and constipation. No  diarrhea, bright red blood per rectum, melena, or hematemesis Neurologic:  No visual changes, wkns, changes in mental status. All other systems reviewed and are otherwise negative except as noted above.  Physical Exam  Blood pressure 135/67, pulse 80, temperature 99.1 F (37.3 C), temperature source Oral, resp. rate 20, height 5\' 3"  (1.6 m), weight 172 lb (78.019 kg), SpO2 96.00%.  General: Pleasant, NAD Psych: Normal affect. Neuro: Alert and oriented X 3. Moves all extremities spontaneously. HEENT: Normal  Neck: Supple without bruits or JVD. Lungs:  Resp regular and unlabored, CTA. Heart: 3/6 syst murmur heard throughout, louder with bearing down. RRR no s3, s4.  Abdomen: Soft, non-tender, non-distended, BS + x 4.  Extremities: No clubbing, cyanosis or edema. DP/PT/Radials 2+ and equal bilaterally.  Labs   Recent Labs  11/05/12 1021 11/05/12 1737 11/05/12 2247 11/06/12 0530  TROPONINI <0.30 <0.30 <0.30 <0.30   Lab Results  Component Value Date   WBC 10.9* 11/07/2012   HGB 11.5* 11/07/2012   HCT 33.9* 11/07/2012   MCV 81.1 11/07/2012   PLT 181 11/07/2012     Recent Labs Lab 11/06/12 0530 11/07/12 0530  NA 140 143  K 4.1 3.6  CL 108 112  CO2 22 20  BUN 20 18  CREATININE 1.36* 0.96  CALCIUM 7.7* 7.8*  PROT 6.3  --   BILITOT 0.6  --   ALKPHOS 68  --   ALT 25  --   AST 34  --   GLUCOSE 97 99   Lab Results  Component Value Date   CHOL 134 11/06/2012   HDL 43 11/06/2012   LDLCALC 76 11/06/2012   TRIG 75 11/06/2012   Radiology/Studies  US Abdomen Complete  11/05/2012  *RADIOLOGY REPORT*  Clinical Data:  Acute pancreatitis.  Assess for underlying gallstones or cholecystitis.  ABDOMINAL ULTRASOUND COMPLETE    IMPRESSION:  1.  Known mild pancreatitis not well assessed on ultrasound. 2.  Mild apparent gallbladder distension; the gallbladder is somewhat tortuous but otherwise  unremarkable in appearance.  No evidence for obstruction or cholecystitis.  Common hepatic duct and common bile duct remain borderline normal in size for the patient's age.  No gallstones identified. 3.  Left hepatic cyst again noted. 4.  Small amount of ascites noted about the liver and spleen, as on the recent CT.   Original Report Authenticated By: Tonia Ghent, M.D.    Ct Abdomen Pelvis W Contrast  11/05/2012  **ADDENDUM** CREATED: 11/05/2012 13:30:01  In further discussion with Dr. Rubin Payor and in light of a significantly elevated serum lipase level, these findings are probably secondary to acute pancreatitis.  **END ADDENDUM** SIGNED BY: Gerrianne Scale, M.D.  11/05/2012  *RADIOLOGY REPORT*  Clinical Data: Diffuse abdominal pain with nausea and slight diarrhea.  CT ABDOMEN AND PELVIS WITH CONTRAST   IMPRESSION:  1.  There is an apparent inflammatory process in the right upper quadrant of the abdomen with ascites and soft tissue edema surrounding the proximal duodenum and right colon. The source for this is not clearly demonstrated and is not felt to be related to the gallbladder, pancreas or appendix. 2.  No evidence of extravasated enteric contrast, bowel obstruction or drainable abscess. 3.  No significant solid parenchymal organ findings. 4.  Small right pleural effusion and right lower lobe atelectasis.   Original Report Authenticated By: Carey Bullocks, M.D.    Dg Chest Port 1 View  11/06/2012  *RADIOLOGY REPORT*  Clinical Data: Hypoxemia.  PORTABLE CHEST - 1 VIEW 11/06/2012 1324 hours:    IMPRESSION: Worsening atelectasis and/or pneumonia in the lower lobes. Probable bilateral pleural effusions.  Developing pulmonary venous hypertension without overt edema currently, query incipient fluid overload.   Original Report Authenticated By: Hulan Saas, M.D.    Dg Abd Acute W/chest  11/05/2012  *RADIOLOGY REPORT*  Clinical Data: Abdominal pain with nausea since yesterday.  ACUTE ABDOMEN SERIES  (ABDOMEN 2 VIEW & CHEST 1 VIEW)   IMPRESSION:  1.  No acute abdominal findings identified. 2.  Low lung volumes with probable bibasilar atelectasis.   Original Report Authenticated By: Carey Bullocks, M.D.    11/07/12 Normal sinus rhythm, 75, nonspecific ST abnormality in setting of LVH   ASSESSMENT AND PLAN  1. Acute Pancreatis:  Per IM.  Volume status looks good.  She is currently off of IVF.  2. HOCM:  Diagnosed previously and followed by cardiologist in [place].  She sounds to be doing relatively well @ home without significant DOE, though she does occasionally have exertional chest pressure (reportedly negative myoview in [place]).  She has no h/o syncope.  In setting of acute pancreatitis, she did receive IVF, though her volume status looks good and we would not recommend any diuresis at this time.  Cont current doses of bb/ccb and consider titration if BP is trending high.  If she wishes to move her cardiology care from [facility] to [place], we would be happy to arrange for f/u with Dr. Jens Som going forward.  We stressed the importance of family screening/echo/treadmill testing.  3. HTN:  Cont bb/ccb.  4.  OSA:  case manager assisting with obtaining CPAP at home.  Signed, Nicolasa Ducking, NP 11/07/2012, 3:22 PM As above, patient seen and examined. Briefly she is a 79 year old female with past medical history of hypertrophic obstructive cardiomyopathy, hypertension, hypothyroidism who I am asked to evaluate for hypertrophic obstructive cardiomyopathy. Patient is followed in [place] for her heart issues. Patient was admitted on 11/05/2012 with abdominal pain and diagnosed with acute pancreatitis. She has been n.p.o. And treated with IV fluids. She was felt to be somewhat volume overloaded and cardiology asked to evaluate. She denies dyspnea at present. She is not having chest pain, palpitations and there is no history of syncope. Echocardiogram this admission shows normal LV function, hypertrophic  obstructive cardiomyopathy with an LVOT gradient of 5 m/s and mild to moderate mitral regurgitation. At present her volume status appears to be reasonable. We would like overloaded significant preload reduction given her hypertrophic obstructive cardiomyopathy. She is presently not being hydrated and is now being given a clear liquid diet. I would not diurese further at this point unless she has respiratory distress. I would recommend continuing  Cardizem and Toprol. I would be happy to follow the patient in the office for her hypertrophic obstructive cardiomyopathy. She would need risk stratification for sudden death including an exercise treadmill, Holter monitor. Her family members also need to be screened. Olga Millers 4:01 PM

## 2012-11-07 NOTE — Evaluation (Signed)
Physical Therapy Evaluation Patient Details Name: CATILYN BOGGUS MRN: [id number] DOB: 1932-02-19 Today's Date: 11/07/2012 Time: 7829-5621 PT Time Calculation (min): 32 min  PT Assessment / Plan / Recommendation Clinical Impression  79 yo female admitted with abdominal pain and found to have pancreatitis.  She presents with generalized weakness and deconditioning with decreased O2 sats to 88 percent on RA and HR to 114 with min activity.  Ancticipate she will benefit from HHPT and RW at d/c    PT Assessment  Patient needs continued PT services    Follow Up Recommendations  Home health PT    Does the patient have the potential to tolerate intense rehabilitation      Barriers to Discharge        Equipment Recommendations  Rolling walker with 5" wheels    Recommendations for Other Services     Frequency Min 3X/week    Precautions / Restrictions Precautions Precautions: None   Pertinent Vitals/Pain O2 sats 88% on RA and HR 114 wih min activity      Mobility  Bed Mobility Bed Mobility: Supine to Sit;Sit to Supine Supine to Sit: 5: Supervision Sit to Supine: 5: Supervision Transfers Transfers: Sit to Stand;Stand to Sit Sit to Stand: 5: Supervision Stand to Sit: 5: Supervision Details for Transfer Assistance: pt with generalized deconditioning from a few days of npo and bedrest Ambulation/Gait Ambulation/Gait Assistance: 5: Supervision Ambulation Distance (Feet): 20 Feet Assistive device: Rolling walker Ambulation/Gait Assistance Details: needs assist for O2 tubing management Gait Pattern: Trunk flexed;Step-through pattern;Decreased step length - left;Decreased step length - right Gait velocity: decreased General Gait Details: pt maintains trunk flexion due to abdominal pain Stairs: No Wheelchair Mobility Wheelchair Mobility: No    Exercises General Exercises - Lower Extremity Ankle Circles/Pumps: AROM;Both;5 reps;Supine Quad Sets: AROM;Both;5 reps;Supine Gluteal  Sets: AROM;Both;5 reps;Seated;Standing Long Arc Quad: AROM;Both;5 reps Hip Flexion/Marching: AROM;Both;5 reps;Supine Other Exercises Other Exercises: bilateral UE hip to hip in supine and sitting Other Exercises: repeated sit to stand X 3 repetitions with trunk and glute extensionin standing Other Exercises: issued incentive spirometer.  Pt able to inspire [phone]   PT Diagnosis: Difficulty walking;Generalized weakness  PT Problem List: Decreased strength;Decreased activity tolerance;Cardiopulmonary status limiting activity PT Treatment Interventions: DME instruction;Gait training;Functional mobility training;Therapeutic activities;Therapeutic exercise;Patient/family education   PT Goals Acute Rehab PT Goals PT Goal Formulation: With patient/family Time For Goal Achievement: 11/21/12 Potential to Achieve Goals: Good Pt will go Supine/Side to Sit: Independently PT Goal: Supine/Side to Sit - Progress: Goal set today Pt will go Sit to Supine/Side: Independently PT Goal: Sit to Supine/Side - Progress: Goal set today Pt will go Sit to Stand: Independently PT Goal: Sit to Stand - Progress: Goal set today Pt will go Stand to Sit: Independently PT Goal: Stand to Sit - Progress: Goal set today Pt will Ambulate: >150 feet;with modified independence;with least restrictive assistive device PT Goal: Ambulate - Progress: Goal set today  Visit Information  Last PT Received On: 11/07/12 Assistance Needed: +1    Subjective Data  Subjective: Pt will go stay with her sister in [place] Patient Stated Goal: to go home tomorrow or the next day   Prior Functioning  Home Living Lives With: Family Available Help at Discharge: Family Type of Home: Mobile home Home Access: Ramped entrance Home Layout: One level Home Adaptive Equipment: None Prior Function Level of Independence: Independent Communication Communication: No difficulties    Cognition  Cognition Overall Cognitive Status:  Appears within functional limits for tasks assessed/performed  Arousal/Alertness: Awake/alert Orientation Level: Appears intact for tasks assessed Behavior During Session: [facility] for tasks performed    Extremity/Trunk Assessment Right Lower Extremity Assessment RLE ROM/Strength/Tone: WFL for tasks assessed RLE Sensation: WFL - Light Touch;WFL - Proprioception Left Lower Extremity Assessment LLE ROM/Strength/Tone: WFL for tasks assessed LLE Sensation: WFL - Light Touch;WFL - Proprioception Trunk Assessment Trunk Assessment: Normal   Balance Balance Balance Assessed: Yes Static Sitting Balance Static Sitting - Balance Support: No upper extremity supported Static Sitting - Level of Assistance: 7: Independent Static Standing Balance Static Standing - Balance Support: No upper extremity supported;During functional activity Static Standing - Level of Assistance: 7: Independent  End of Session PT - End of Session Equipment Utilized During Treatment: Oxygen Activity Tolerance: Patient limited by fatigue Patient left: in chair;with family/visitor present  GP   Rosey Bath K. Manson Passey, [place] [phone] 11/07/2012, 9:54 AM

## 2012-11-07 NOTE — Progress Notes (Signed)
NP on call made aware of pts vitals. No new orders given. Will continue to monitor.

## 2012-11-07 NOTE — Progress Notes (Signed)
IV protonix changed to PO per P&T policy. 40 mg po BID. Thanks Herby Abraham, Pharm.D. [phone] 11/07/2012 10:06 AM

## 2012-11-07 NOTE — Progress Notes (Signed)
[facility] PROGRESS NOTE  Elizabeth Shields ZOX:[id number] DOB: 06-Oct-1931 DOA: 11/05/2012 PCP: No primary provider on file.  PCP is Dr. Esther Hardy, [facility].  Fax:  [phone] Cardiologist:  Dr. Gerrianne Scale   Patient is a 79 year old female with history of hypertension, hypothyroidism, arthritis presented to [facility] ED with abdominal pain. History was obtained from the patient and her family present in the room. Patient reported having epigastric pain which started yesterday, radiating to right upper quadrant and left upper quadrant, 8/10, sharp with nausea. She did not have any vomiting however had to try eating and unable to hold anything down. She did have constipation but last bowel movement was today which did not improve her pain.  ED workup showed lipase more than 3000, CT of the abdomen and pelvis showing possibly acute pancreatitis with apparent inflammatory process in right upper quadrant of the abdomen with ascites and soft tissue edema surrounding the proximal duodenum and right colon.   Assessment/Plan:  SIRS secondary to acute pancreatitis Resolving.  WBC decreasing, fever curve down trending.  Acute pancreatitis Uncertain etiology:  No gallstones, no alcohol, Lipids are normal, calcium not elevated, no new meds. (? If statins would cause pancreatitis)  May need MRCP in a few days to rule out pancreas divisum. Possible low flow state (from hypotension). -pain improved today, will advance diet to clears. -Dilaudid for pain (continuous pulse ox)  Fluid Overload in the setting of Pancreatitis and HOCM with mild diastolic heart failure - Patient needing 4 L of oxygen & bringing up white foam in her mouth. (not on oxygen at home) - SOB with exertion at home. - Will request cardiology assistance to manage the fluid balance with HOCM. - Patient sees Dr. Christell Constant in [facility]  Hypertension -well controlled. -Diltiazem 180 mg q  day -Toprol xl 100 mg q day  Hypothyroidism -Started Synthroid approximately 6-9 months ago. -On synthroid 25 mcg  Hypertrophic Obstructive Cardiomyopathy ?ST depression on EKG.  Repeat EKG appeared normal. Troponins x 3 negative Echo result EF of 65-70 with grade 1 diastolic dysfunction. Findings consistent with hypertrophic obstructive cardiomyopathy with severe LV outflow obstruction at rest. Patient on BB and Diltiazem  Unsteady Gait PT eval Completed Home Health PT ordered.  Obstructive Sleep Apnea CPAP QHS ordered Case manager assisting with obtaining CPAP at home.  DVT Prophylaxis:  lovenox  Code Status: full Family Communication: Daughter Vikki Ports at bedside Disposition Plan: Inpatient.  PT eval ordered.   Consultants:  Procedures:  Echo result pending  Antibiotics:    HPI/Subjective: Vomiting resolved.  Pain improved.  Now with difficulty breathing and white foam coming up in her mouth.  Objective: Filed Vitals:   11/07/12 0132 11/07/12 0250 11/07/12 0558 11/07/12 1004  BP: 156/69 135/78 150/71 139/60  Pulse: 105 105 110 100  Temp: 98.4 F (36.9 C) 99.6 F (37.6 C) 99.1 F (37.3 C)   TempSrc:  Oral Oral   Resp: 18 20 20    Height:      Weight:      SpO2:  94% 94%     Intake/Output Summary (Last 24 hours) at 11/07/12 1211 Last data filed at 11/07/12 1011  Gross per 24 hour  Intake      0 ml  Output   1000 ml  Net  -1000 ml   Filed Weights   11/05/12 0919  Weight: 78.019 kg (172 lb)    Exam:   General:  A&O, NAD, Lying comfortably in bed,  slightly diaphoretic, extremely pleasant.  Cardiovascular: RRR, Benign systolic murmur 2/6 , rubs or gallops, no lower extremity edema  Respiratory: CTA, no wheeze, crackles, or rales.  No increased work of breathing.   Abdomen: Soft, diffusely tender to palpation particularly in the epigastrum, non-distended, + bowel sounds, no masses  Musculoskeletal: Able to move all 4 extremities, 5/5 strength  in each  Data Reviewed: Basic Metabolic Panel:  Recent Labs Lab 11/05/12 1015 11/05/12 1736 11/06/12 0530 11/07/12 0530  NA 138  --  140 143  K 3.7  --  4.1 3.6  CL 101  --  108 112  CO2 23  --  22 20  GLUCOSE 133*  --  97 99  BUN 14  --  20 18  CREATININE 0.90 0.96 1.36* 0.96  CALCIUM 9.5  --  7.7* 7.8*   Liver Function Tests:  Recent Labs Lab 11/05/12 1015 11/06/12 0530  AST 49* 34  ALT 37* 25  ALKPHOS 91 68  BILITOT 0.8 0.6  PROT 8.1 6.3  ALBUMIN 3.9 3.0*    Recent Labs Lab 11/05/12 1015 11/06/12 0530  LIPASE >3000* 2395*    CBC:  Recent Labs Lab 11/05/12 1015 11/05/12 1736 11/06/12 0530 11/07/12 0530  WBC 11.4* 11.7* 14.3* 10.9*  NEUTROABS 9.0*  --   --   --   HGB 15.5* 14.0 12.6 11.5*  HCT 45.0 40.6 37.2 33.9*  MCV 79.2 80.2 80.5 81.1  PLT 224 140* 200 181   Cardiac Enzymes:  Recent Labs Lab 11/05/12 1021 11/05/12 1737 11/05/12 2247 11/06/12 0530  TROPONINI <0.30 <0.30 <0.30 <0.30     Studies: US Abdomen Complete  11/05/2012  *RADIOLOGY REPORT*  Clinical Data:  Acute pancreatitis.  Assess for underlying gallstones or cholecystitis.  ABDOMINAL ULTRASOUND COMPLETE  Comparison:  CT of the abdomen and pelvis performed earlier today at 01:01 p.m.  Findings:  Gallbladder:  The gallbladder appears mildly distended.  It demonstrates multiple folds, with some degree of tortuosity, but is otherwise unremarkable in appearance.  No gallbladder wall thickening or pericholecystic fluid is seen.  No ultrasonographic Murphy's sign is elicited.  Common Bile Duct:  0.8 cm in diameter; within normal limits in caliber, given the patient's age.  Liver:  Normal parenchymal echogenicity and echotexture; apparent 2.2 x 2.1 x 1.2 cm hypoechoic cyst is again noted at the left hepatic lobe.  Limited Doppler evaluation demonstrates normal blood flow within the liver.  A small amount of ascites is noted about the liver and spleen, as on the recent CT.  IVC:  Unremarkable  in appearance.  Pancreas: The patient's known mild pancreatitis is not well assessed on ultrasound. The common bile duct measures 0.8 cm at the head of the pancreas, borderline normal in size.  Spleen:  7.1 cm in length; within normal limits in size and echotexture.  Right kidney:  9.4 cm in length; normal in size, configuration and parenchymal echogenicity.  No evidence of mass or hydronephrosis. Mild fetal lobulations are suggested.  Left kidney:  9.0 cm in length; normal in size, configuration and parenchymal echogenicity.  No evidence of mass or hydronephrosis. Mild fetal lobulations are suggested.  Abdominal Aorta:  Normal in caliber; no aneurysm identified.  Not visualized at the level of the bifurcation due to overlying bowel gas.  IMPRESSION:  1.  Known mild pancreatitis not well assessed on ultrasound. 2.  Mild apparent gallbladder distension; the gallbladder is somewhat tortuous but otherwise unremarkable in appearance.  No evidence for obstruction or cholecystitis.  Common hepatic duct and common bile duct remain borderline normal in size for the patient's age.  No gallstones identified. 3.  Left hepatic cyst again noted. 4.  Small amount of ascites noted about the liver and spleen, as on the recent CT.   Original Report Authenticated By: Tonia Ghent, M.D.    Ct Abdomen Pelvis W Contrast  11/05/2012  **ADDENDUM** CREATED: 11/05/2012 13:30:01  In further discussion with Dr. Rubin Payor and in light of a significantly elevated serum lipase level, these findings are probably secondary to acute pancreatitis.  **END ADDENDUM** SIGNED BY: Gerrianne Scale, M.D.   11/05/2012  *RADIOLOGY REPORT*  Clinical Data: Diffuse abdominal pain with nausea and slight diarrhea.  CT ABDOMEN AND PELVIS WITH CONTRAST  Technique:  Multidetector CT imaging of the abdomen and pelvis was performed following the standard protocol during bolus administration of intravenous contrast.  Contrast: 50mL OMNIPAQUE IOHEXOL 300 MG/ML  SOLN,  OMNIPAQUE IOHEXOL 300 MG/ML  SOLN  Comparison: Acute abdominal series same date.  Findings: There is a small right pleural effusion with associated right lower lobe atelectasis.  The lung bases are otherwise clear. Faint coronary calcifications are noted.  There is a moderate amount of perihepatic ascites.  Fluid tracks along the right paracolic gutter into the pelvis.  There is minimal fluid surrounding the gallbladder, and no significant gallbladder wall thickening.  Edema surrounds the proximal duodenum.  There is no duodenal or colonic wall thickening.  There is no extravasated enteric contrast.  The pancreas demonstrates normal density and enhancement.  There is no peripancreatic fluid collection or biliary dilatation.  There are several well circumscribed low density hepatic lesions most consistent with cysts.  The largest of these is in the lateral segment left hepatic lobe, measuring 2.5 cm on image 19.  The spleen and adrenal glands appear normal.  There are tiny low density right renal lesions.  Although there is mildly asymmetric perinephric soft tissue stranding on the right, no primary renal process is identified.  The stomach is incompletely distended.  The small bowel and colon within the pelvis appear normal.  The cecum is situated within the right mid abdomen.  The appendix demonstrates no definite inflammation.  There is mild aorto iliac atherosclerosis.  The bladder, uterus and ovaries appear normal.  The lower lumbar spine degenerative changes are present with a grade 1 anterolisthesis at L3-L4 and L4-L5.  No acute osseous findings are seen.  IMPRESSION:  1.  There is an apparent inflammatory process in the right upper quadrant of the abdomen with ascites and soft tissue edema surrounding the proximal duodenum and right colon. The source for this is not clearly demonstrated and is not felt to be related to the gallbladder, pancreas or appendix. 2.  No evidence of extravasated enteric  contrast, bowel obstruction or drainable abscess. 3.  No significant solid parenchymal organ findings. 4.  Small right pleural effusion and right lower lobe atelectasis.   Original Report Authenticated By: Carey Bullocks, M.D.    Dg Chest Port 1 View  11/06/2012  *RADIOLOGY REPORT*  Clinical Data: Hypoxemia.  PORTABLE CHEST - 1 VIEW 11/06/2012 1324 hours:  Comparison: One-view abdomen x-ray yesterday.  Findings: Interval worsening of airspace consolidation in the lower lobes with possible bilateral pleural effusions.  Cardiac silhouette upper normal in size for technique and degree of inspiration.  Mild pulmonary venous hypertension without overt edema, increased since yesterday.  IMPRESSION: Worsening atelectasis and/or pneumonia in the lower lobes. Probable bilateral pleural effusions.  Developing  pulmonary venous hypertension without overt edema currently, query incipient fluid overload.   Original Report Authenticated By: Hulan Saas, M.D.     Scheduled Meds: . aspirin  81 mg Oral Daily  . diltiazem  180 mg Oral Daily  . enoxaparin (LOVENOX) injection  40 mg Subcutaneous Q24H  . furosemide  20 mg Intravenous Once  . levothyroxine  25 mcg Oral QAC breakfast  . metoprolol succinate  100 mg Oral Daily  . pantoprazole  40 mg Oral BID   Continuous Infusions:    Principal Problem:   Pancreatitis, acute Active Problems:   HTN (hypertension)   Abdominal pain, epigastric, RUQ, LUQ   Nonspecific changes on EKG   Unspecified hypothyroidism    Conley Canal  [facility] Pager [phone]. If 7PM-7AM, please contact night-coverage at [email or web address], password [facility] 11/07/2012, 12:11 PM  LOS: 2 days   Attending Patient seen and examined, the with the above assessment and plan. Pancreatitis seems to be slowly resolving, there is a soft, would start on clear liquids. Etiology of pancreatitis remains unknown. She does have acute diastolic heart failure from Fluid overload-does have a  history of HOCM (strong family history as well)-consulting cardiology.  S Dorothy Landgrebe

## 2012-11-08 ENCOUNTER — Inpatient Hospital Stay (HOSPITAL_COMMUNITY): Payer: Medicare Other

## 2012-11-08 DIAGNOSIS — G4733 Obstructive sleep apnea (adult) (pediatric): Secondary | ICD-10-CM

## 2012-11-08 DIAGNOSIS — I5031 Acute diastolic (congestive) heart failure: Secondary | ICD-10-CM

## 2012-11-08 LAB — CBC
HCT: 35.2 % — ABNORMAL LOW (ref 36.0–46.0)
Hemoglobin: 12.2 g/dL (ref 12.0–15.0)
MCH: 27.9 pg (ref 26.0–34.0)
MCHC: 34.7 g/dL (ref 30.0–36.0)
RDW: 14.9 % (ref 11.5–15.5)

## 2012-11-08 MED ORDER — SIMETHICONE 80 MG PO CHEW
160.0000 mg | CHEWABLE_TABLET | Freq: Four times a day (QID) | ORAL | Status: DC
  Administered 2012-11-08 – 2012-11-10 (×10): 160 mg via ORAL
  Filled 2012-11-08 (×15): qty 2

## 2012-11-08 MED ORDER — FUROSEMIDE 10 MG/ML IJ SOLN
20.0000 mg | Freq: Once | INTRAMUSCULAR | Status: AC
  Administered 2012-11-08: 20 mg via INTRAVENOUS

## 2012-11-08 NOTE — Progress Notes (Signed)
Physical Therapy Treatment Patient Details Name: DEVANEY SEGERS MRN: [id number] DOB: 1932-01-27 Today's Date: 11/08/2012 Time: 1107-1120 PT Time Calculation (min): 13 min  PT Assessment / Plan / Recommendation Comments on Treatment Session       Follow Up Recommendations  Home health PT     Does the patient have the potential to tolerate intense rehabilitation     Barriers to Discharge        Equipment Recommendations  Rolling walker with 5" wheels    Recommendations for Other Services    Frequency Min 3X/week   Plan Discharge plan remains appropriate;Frequency remains appropriate    Precautions / Restrictions Precautions Precautions: None   Pertinent Vitals/Pain No c/o pain.  SpO2 >95% on 2LO2 via [place].    Mobility  Bed Mobility Bed Mobility: Sit to Supine Supine to Sit: 5: Supervision Sit to Supine: 5: Supervision Details for Bed Mobility Assistance: no assistance required. Transfers Transfers: Sit to Stand;Stand to Sit Sit to Stand: 5: Supervision Stand to Sit: 5: Supervision Details for Transfer Assistance: Supervision for safety no cueing or assist needed.  Ambulation/Gait Ambulation/Gait Assistance: 5: Supervision Ambulation Distance (Feet): 150 Feet Assistive device: Rolling walker Ambulation/Gait Assistance Details: VCs to maintain safe distance from RW Gait Pattern: Trunk flexed;Step-through pattern;Decreased step length - left;Decreased step length - right Gait velocity: decreased General Gait Details: No trunk flexion noted.  Pt required 2 standing rest breaks secondary to fatigue.   Stairs: No Wheelchair Mobility Wheelchair Mobility: No    Exercises     PT Diagnosis:    PT Problem List:   PT Treatment Interventions:     PT Goals Acute Rehab PT Goals PT Goal Formulation: With patient/family Time For Goal Achievement: 11/21/12 Potential to Achieve Goals: Good Pt will go Supine/Side to Sit: Independently PT Goal: Supine/Side to Sit - Progress:  Progressing toward goal Pt will go Sit to Supine/Side: Independently PT Goal: Sit to Supine/Side - Progress: Progressing toward goal Pt will go Sit to Stand: Independently PT Goal: Sit to Stand - Progress: Progressing toward goal Pt will go Stand to Sit: Independently PT Goal: Stand to Sit - Progress: Progressing toward goal Pt will Ambulate: >150 feet;with modified independence;with least restrictive assistive device PT Goal: Ambulate - Progress: Progressing toward goal  Visit Information  Last PT Received On: 11/08/12 Assistance Needed: +1    Subjective Data  Patient Stated Goal: to go home tomorrow or the next day   Cognition  Cognition Overall Cognitive Status: Appears within functional limits for tasks assessed/performed Arousal/Alertness: Awake/alert Orientation Level: Appears intact for tasks assessed Behavior During Session: [facility] for tasks performed    Balance  Balance Balance Assessed: No  End of Session PT - End of Session Equipment Utilized During Treatment: Oxygen Activity Tolerance: Patient tolerated treatment well Patient left: in bed;with call bell/phone within reach Nurse Communication: Mobility status   GP     Kaycen Whitworth 11/08/2012, 12:22 PM Yuriel Lopezmartinez L. Tarik Teixeira DPT [phone]

## 2012-11-08 NOTE — Progress Notes (Signed)
[facility] PROGRESS NOTE  ONEY TATLOCK WUX:[id number] DOB: 1931/08/13 DOA: 11/05/2012 PCP: No primary provider on file.  PCP is Dr. Esther Hardy, [facility].  Fax:  [phone] Cardiologist:  Dr. Gerrianne Scale   Patient is a 79 year old female with history of hypertension, hypothyroidism, arthritis presented to [facility] ED with abdominal pain. History was obtained from the patient and her family present in the room. Patient reported having epigastric pain which started yesterday, radiating to right upper quadrant and left upper quadrant, 8/10, sharp with nausea. She did not have any vomiting however had to try eating and unable to hold anything down. She did have constipation but last bowel movement was today which did not improve her pain.  ED workup showed lipase more than 3000, CT of the abdomen and pelvis showing possibly acute pancreatitis with apparent inflammatory process in right upper quadrant of the abdomen with ascites and soft tissue edema surrounding the proximal duodenum and right colon.   Assessment/Plan:  SIRS secondary to acute pancreatitis Resolved.    Acute pancreatitis Uncertain etiology:  No gallstones, no alcohol, Lipids are normal, calcium not elevated, no new meds. (? If statins would cause pancreatitis)  May need MRCP in a few days to rule out pancreas divisum. Possible low flow state (from hypotension). -pain improved today, will advance diet to low fat solids. -Dilaudid for pain (continuous pulse ox) -Patient will need follow up CT scan in 1 month at her primary care physician's discretion to ensure the pancreatitis has resolved.  Acute diastolic heart failure exacerbation in the setting of HOCM - Patient weaning to 2 L of oxygen but still bringing up white foam in her mouth. (not on oxygen at home) - BNP 2600+ - SOB with exertion at home. - Appreciate Cardiology consultation. - Will continue with very  careful diuresis.  Per cardiology patient's with HOCM should stay "on the wet side" - Patient sees Dr. Christell Constant in [facility]  Hypertension -reasonably controlled. -Diltiazem 180 mg q day -Toprol xl 100 mg q day  Hypothyroidism -Started Synthroid approximately 6-9 months ago. -On synthroid 25 mcg  Hypertrophic Obstructive Cardiomyopathy ?ST depression on EKG.  Repeat EKG appeared normal. Troponins x 3 negative Echo result EF of 65-70 with grade 1 diastolic dysfunction. Findings consistent with hypertrophic obstructive cardiomyopathy with severe LV outflow obstruction at rest. Patient on BB and Diltiazem Will follow with primary cardiologist in [facility] after D/C  Unsteady Gait PT eval Completed Home Health PT ordered.  Obstructive Sleep Apnea CPAP QHS ordered Case manager assisting with obtaining CPAP at home.  DVT Prophylaxis:  lovenox  Code Status: full Family Communication: Blossom Hoops and Sam at bedside Disposition Plan: Inpatient. Hopefully will D/C 4/11   Consultants:  Procedures:  Echo Study Conclusions  - Left ventricle: The cavity size was normal. There was mild asymmetric hypertrophy of the septum, consistent with hypertrophic cardiomyopathy. Systolic function was vigorous. The estimated ejection fraction was in the range of 65% to 70%. There was dynamic obstruction at restin the outflow tract, with a peak velocity of 500cm/sec and a peak gradient of Hg. Wall motion was normal; there were no regional wall motion abnormalities. Doppler parameters are consistent with abnormal left ventricular relaxation (grade 1 diastolic dysfunction). Doppler parameters are consistent with elevated mean left atrial filling pressure. - Aortic valve: Mid systolic pre-closure was observed, consistent with dynamic subvalvular stenosis. - Mitral valve: Calcified annulus. There was severe systolic anterior motion of the anterior leaflet. Mild  to moderate mid-to-late systolic  regurgitation directed eccentrically and posteriorly, due to anterior leaflet systolic anterior motion. - Left atrium: The atrium was mildly dilated. - Atrial septum: No defect or patent foramen ovale was identified. - Pulmonary arteries: Systolic pressure was moderately increased. PA peak pressure: 57mm Hg (S). Impressions: - Findings are consistent with hypertrophic obstructive cardiomyopathy with severe LV outflow obstruction at rest.    Antibiotics:    HPI/Subjective: Tolerated clears with some pain last night.  Not asking for pain medications.  Will try solids today.  Had 2-3 loose stools. Still SOB.  Objective: Filed Vitals:   11/07/12 1449 11/07/12 2131 11/08/12 0600 11/08/12 1019  BP: 135/67 145/74 145/78 157/84  Pulse: 80 65 84   Temp: 99.1 F (37.3 C) 98.6 F (37 C) 98 F (36.7 C)   TempSrc: Oral Oral Oral   Resp: 20 18 20    Height:      Weight:      SpO2: 96% 93% 93%     Intake/Output Summary (Last 24 hours) at 11/08/12 1122 Last data filed at 11/08/12 0900  Gross per 24 hour  Intake    340 ml  Output    900 ml  Net   -560 ml   Filed Weights   11/05/12 0919  Weight: 78.019 kg (172 lb)    Exam:   General:  A&O, NAD, Lying comfortably in bed, , extremely pleasant. More talkative today.  Cardiovascular: RRR, systolic murmur 2/6 , rubs or gallops, no lower extremity edema  Respiratory: CTA, no wheeze, crackles, or rales.  No increased work of breathing.   Abdomen: Soft, non tender, non-distended, + bowel sounds, no masses  Musculoskeletal: Able to move all 4 extremities, 5/5 strength in each  Data Reviewed: Basic Metabolic Panel:  Recent Labs Lab 11/05/12 1015 11/05/12 1736 11/06/12 0530 11/07/12 0530  NA 138  --  140 143  K 3.7  --  4.1 3.6  CL 101  --  108 112  CO2 23  --  22 20  GLUCOSE 133*  --  97 99  BUN 14  --  20 18  CREATININE 0.90 0.96 1.36* 0.96  CALCIUM 9.5  --  7.7* 7.8*   Liver Function Tests:  Recent Labs Lab  11/05/12 1015 11/06/12 0530  AST 49* 34  ALT 37* 25  ALKPHOS 91 68  BILITOT 0.8 0.6  PROT 8.1 6.3  ALBUMIN 3.9 3.0*    Recent Labs Lab 11/05/12 1015 11/06/12 0530  LIPASE >3000* 2395*    CBC:  Recent Labs Lab 11/05/12 1015 11/05/12 1736 11/06/12 0530 11/07/12 0530  WBC 11.4* 11.7* 14.3* 10.9*  NEUTROABS 9.0*  --   --   --   HGB 15.5* 14.0 12.6 11.5*  HCT 45.0 40.6 37.2 33.9*  MCV 79.2 80.2 80.5 81.1  PLT 224 140* 200 181   Cardiac Enzymes:  Recent Labs Lab 11/05/12 1021 11/05/12 1737 11/05/12 2247 11/06/12 0530  TROPONINI <0.30 <0.30 <0.30 <0.30     Studies: Dg Chest 2 View  11/08/2012  *RADIOLOGY REPORT*  Clinical Data: Evaluate for congestive heart failure.  CHEST - 2 VIEW  Comparison: Chest x-ray 11/06/2012.  Findings: Lung volumes are slightly low.  There are bibasilar opacities most compatible with moderate bilateral pleural effusions with superimposed passive atelectasis.  No evidence of pulmonary edema.  Heart size appears to be within normal limits but is partially obscured.  Upper mediastinal contours are within normal limits.  Atherosclerosis in the thoracic aorta.  IMPRESSION:  1.  No findings to suggest congestive heart failure. Pulmonary venous congestion noted on the prior study has resolved on today's examination. 2.  There are moderate bilateral pleural effusions and probable bibasilar passive atelectasis.   Original Report Authenticated By: Trudie Reed, M.D.    Dg Chest Port 1 View  11/06/2012  *RADIOLOGY REPORT*  Clinical Data: Hypoxemia.  PORTABLE CHEST - 1 VIEW 11/06/2012 1324 hours:  Comparison: One-view abdomen x-ray yesterday.  Findings: Interval worsening of airspace consolidation in the lower lobes with possible bilateral pleural effusions.  Cardiac silhouette upper normal in size for technique and degree of inspiration.  Mild pulmonary venous hypertension without overt edema, increased since yesterday.  IMPRESSION: Worsening atelectasis  and/or pneumonia in the lower lobes. Probable bilateral pleural effusions.  Developing pulmonary venous hypertension without overt edema currently, query incipient fluid overload.   Original Report Authenticated By: Hulan Saas, M.D.     Scheduled Meds: . aspirin  81 mg Oral Daily  . diltiazem  180 mg Oral Daily  . enoxaparin (LOVENOX) injection  40 mg Subcutaneous Q24H  . levothyroxine  25 mcg Oral QAC breakfast  . metoprolol succinate  100 mg Oral Daily  . pantoprazole  40 mg Oral BID  . simethicone  160 mg Oral QID   Continuous Infusions:    Principal Problem:   Pancreatitis, acute Active Problems:   HTN (hypertension)   Abdominal pain, epigastric, RUQ, LUQ   Nonspecific changes on EKG   Unspecified hypothyroidism   HOCM (hypertrophic obstructive cardiomyopathy)   OSA (obstructive sleep apnea)   Acute diastolic heart failure    Conley Canal  [facility] Pager [phone]. If 7PM-7AM, please contact night-coverage at [email or web address], password [facility] 11/08/2012, 11:22 AM  LOS: 3 days   Attending Patient seen and examined, agree with the assessment and plan as outlined above. She is much better today-will attempt to advance to low fat diet. She still has some rales in bibasilar lungs-therefore will try to give one more dose of low dose lasix to judiciously attempt to diureses in light of HOCM.Appreciate Cards input.  Windell Norfolk MD

## 2012-11-08 NOTE — Progress Notes (Signed)
Patient ID: Elizabeth Shields, female   DOB: July 07, 1932, 79 y.o.   MRN: [id number]   SUBJECTIVE: The patient says she is feeling better this morning. She is not complaining of shortness of breath. However her O2 sats are low. There has been very careful attention to her volume status. Everyone has noted that with HOCM it is extremely important that she not become volume depleted. However her chest x-ray had suggested the question of some mild volume overload. She does have rales on exam.    Filed Vitals:   11/07/12 1004 11/07/12 1449 11/07/12 2131 11/08/12 0600  BP: 139/60 135/67 145/74 145/78  Pulse: 100 80 65 84  Temp:  99.1 F (37.3 C) 98.6 F (37 C) 98 F (36.7 C)  TempSrc:  Oral Oral Oral  Resp:  20 18 20   Height:      Weight:      SpO2:  96% 93% 93%    Intake/Output Summary (Last 24 hours) at 11/08/12 0708 Last data filed at 11/07/12 2345  Gross per 24 hour  Intake    450 ml  Output   1125 ml  Net   -675 ml    LABS: Basic Metabolic Panel:  Recent Labs  95/62/13 0530 11/07/12 0530  NA 140 143  K 4.1 3.6  CL 108 112  CO2 22 20  GLUCOSE 97 99  BUN 20 18  CREATININE 1.36* 0.96  CALCIUM 7.7* 7.8*   Liver Function Tests:  Recent Labs  11/05/12 1015 11/06/12 0530  AST 49* 34  ALT 37* 25  ALKPHOS 91 68  BILITOT 0.8 0.6  PROT 8.1 6.3  ALBUMIN 3.9 3.0*    Recent Labs  11/05/12 1015 11/06/12 0530  LIPASE >3000* 2395*   CBC:  Recent Labs  11/05/12 1015  11/06/12 0530 11/07/12 0530  WBC 11.4*  < > 14.3* 10.9*  NEUTROABS 9.0*  --   --   --   HGB 15.5*  < > 12.6 11.5*  HCT 45.0  < > 37.2 33.9*  MCV 79.2  < > 80.5 81.1  PLT 224  < > 200 181  < > = values in this interval not displayed. Cardiac Enzymes:  Recent Labs  11/05/12 1737 11/05/12 2247 11/06/12 0530  TROPONINI <0.30 <0.30 <0.30   BNP: No components found with this basename: POCBNP,  D-Dimer: No results found for this basename: DDIMER,  in the last 72 hours Hemoglobin A1C: No  results found for this basename: HGBA1C,  in the last 72 hours Fasting Lipid Panel:  Recent Labs  11/06/12 1005  CHOL 134  HDL 43  LDLCALC 76  TRIG 75  CHOLHDL 3.1   Thyroid Function Tests:  Recent Labs  11/05/12 1736  TSH 5.204*    RADIOLOGY: US Abdomen Complete  11/05/2012  *RADIOLOGY REPORT*  Clinical Data:  Acute pancreatitis.  Assess for underlying gallstones or cholecystitis.  ABDOMINAL ULTRASOUND COMPLETE  Comparison:  CT of the abdomen and pelvis performed earlier today at 01:01 p.m.  Findings:  Gallbladder:  The gallbladder appears mildly distended.  It demonstrates multiple folds, with some degree of tortuosity, but is otherwise unremarkable in appearance.  No gallbladder wall thickening or pericholecystic fluid is seen.  No ultrasonographic Murphy's sign is elicited.  Common Bile Duct:  0.8 cm in diameter; within normal limits in caliber, given the patient's age.  Liver:  Normal parenchymal echogenicity and echotexture; apparent 2.2 x 2.1 x 1.2 cm hypoechoic cyst is again noted at the left hepatic lobe.  Limited Doppler evaluation demonstrates normal blood flow within the liver.  A small amount of ascites is noted about the liver and spleen, as on the recent CT.  IVC:  Unremarkable in appearance.  Pancreas: The patient's known mild pancreatitis is not well assessed on ultrasound. The common bile duct measures 0.8 cm at the head of the pancreas, borderline normal in size.  Spleen:  7.1 cm in length; within normal limits in size and echotexture.  Right kidney:  9.4 cm in length; normal in size, configuration and parenchymal echogenicity.  No evidence of mass or hydronephrosis. Mild fetal lobulations are suggested.  Left kidney:  9.0 cm in length; normal in size, configuration and parenchymal echogenicity.  No evidence of mass or hydronephrosis. Mild fetal lobulations are suggested.  Abdominal Aorta:  Normal in caliber; no aneurysm identified.  Not visualized at the level of the  bifurcation due to overlying bowel gas.  IMPRESSION:  1.  Known mild pancreatitis not well assessed on ultrasound. 2.  Mild apparent gallbladder distension; the gallbladder is somewhat tortuous but otherwise unremarkable in appearance.  No evidence for obstruction or cholecystitis.  Common hepatic duct and common bile duct remain borderline normal in size for the patient's age.  No gallstones identified. 3.  Left hepatic cyst again noted. 4.  Small amount of ascites noted about the liver and spleen, as on the recent CT.   Original Report Authenticated By: Tonia Ghent, M.D.    Ct Abdomen Pelvis W Contrast  11/05/2012  **ADDENDUM** CREATED: 11/05/2012 13:30:01  In further discussion with Dr. Rubin Payor and in light of a significantly elevated serum lipase level, these findings are probably secondary to acute pancreatitis.  **END ADDENDUM** SIGNED BY: Gerrianne Scale, M.D.   11/05/2012  *RADIOLOGY REPORT*  Clinical Data: Diffuse abdominal pain with nausea and slight diarrhea.  CT ABDOMEN AND PELVIS WITH CONTRAST  Technique:  Multidetector CT imaging of the abdomen and pelvis was performed following the standard protocol during bolus administration of intravenous contrast.  Contrast: 50mL OMNIPAQUE IOHEXOL 300 MG/ML  SOLN, OMNIPAQUE IOHEXOL 300 MG/ML  SOLN  Comparison: Acute abdominal series same date.  Findings: There is a small right pleural effusion with associated right lower lobe atelectasis.  The lung bases are otherwise clear. Faint coronary calcifications are noted.  There is a moderate amount of perihepatic ascites.  Fluid tracks along the right paracolic gutter into the pelvis.  There is minimal fluid surrounding the gallbladder, and no significant gallbladder wall thickening.  Edema surrounds the proximal duodenum.  There is no duodenal or colonic wall thickening.  There is no extravasated enteric contrast.  The pancreas demonstrates normal density and enhancement.  There is no peripancreatic fluid  collection or biliary dilatation.  There are several well circumscribed low density hepatic lesions most consistent with cysts.  The largest of these is in the lateral segment left hepatic lobe, measuring 2.5 cm on image 19.  The spleen and adrenal glands appear normal.  There are tiny low density right renal lesions.  Although there is mildly asymmetric perinephric soft tissue stranding on the right, no primary renal process is identified.  The stomach is incompletely distended.  The small bowel and colon within the pelvis appear normal.  The cecum is situated within the right mid abdomen.  The appendix demonstrates no definite inflammation.  There is mild aorto iliac atherosclerosis.  The bladder, uterus and ovaries appear normal.  The lower lumbar spine degenerative changes are present with a grade 1 anterolisthesis  at L3-L4 and L4-L5.  No acute osseous findings are seen.  IMPRESSION:  1.  There is an apparent inflammatory process in the right upper quadrant of the abdomen with ascites and soft tissue edema surrounding the proximal duodenum and right colon. The source for this is not clearly demonstrated and is not felt to be related to the gallbladder, pancreas or appendix. 2.  No evidence of extravasated enteric contrast, bowel obstruction or drainable abscess. 3.  No significant solid parenchymal organ findings. 4.  Small right pleural effusion and right lower lobe atelectasis.   Original Report Authenticated By: Carey Bullocks, M.D.    Dg Chest Port 1 View  11/06/2012  *RADIOLOGY REPORT*  Clinical Data: Hypoxemia.  PORTABLE CHEST - 1 VIEW 11/06/2012 1324 hours:  Comparison: One-view abdomen x-ray yesterday.  Findings: Interval worsening of airspace consolidation in the lower lobes with possible bilateral pleural effusions.  Cardiac silhouette upper normal in size for technique and degree of inspiration.  Mild pulmonary venous hypertension without overt edema, increased since yesterday.  IMPRESSION:  Worsening atelectasis and/or pneumonia in the lower lobes. Probable bilateral pleural effusions.  Developing pulmonary venous hypertension without overt edema currently, query incipient fluid overload.   Original Report Authenticated By: Hulan Saas, M.D.    Dg Abd Acute W/chest  11/05/2012  *RADIOLOGY REPORT*  Clinical Data: Abdominal pain with nausea since yesterday.  ACUTE ABDOMEN SERIES (ABDOMEN 2 VIEW & CHEST 1 VIEW)  Comparison: None.  Findings: There are low lung volumes with patchy right greater than left basilar pulmonary opacities.  No significant pleural effusion is identified.  Heart size and mediastinal contours are normal. Scattered osteophytes of the thoracic spine are noted.  The bowel gas pattern is normal.  There is no free intraperitoneal air.  There are no suspicious calcifications.  Small pelvic calcifications on the left are probably phleboliths.  There is a mild convex right lumbar scoliosis.  IMPRESSION:  1.  No acute abdominal findings identified. 2.  Low lung volumes with probable bibasilar atelectasis.   Original Report Authenticated By: Carey Bullocks, M.D.     PHYSICAL EXAM   Patient is oriented to person time and place. Affect is normal. She has diffuse rales. There is no respiratory distress. Cardiac exam reveals S1 and S2. There is a high pitched crescendo decrescendo systolic murmur consistent with her obstructive cardiomyopathy. There is trace peripheral edema.   TELEMETRY: I have reviewed telemetry today November 08, 2012. There sinus rhythm. The rate is 75.   ASSESSMENT AND PLAN:     Pancreatitis, acute     Treated by the primary team    HTN (hypertension)     Blood pressure is stable. Heart rate is in the range of 75. With HOCM, consideration can be given to pushing the calcium channel blocker and a beta blocker dose is higher to continue to slow the heart rate and control blood pressure.    HOCM (hypertrophic obstructive cardiomyopathy)      There is a  complete consult note from our team from yesterday. Everyone understands that very careful attention has to be paid to her volume status. It is very important that she not be dehydrated. However, she is decreasing oxygen saturation and she has significant rales on physical exam. As of today very gentle diuresis is recommended. The patient had received 20 mg of Lasix yesterday. There was very slight diuresis. I would continue the same approach today following her overall volume status very carefully. I have discussed this with the  primary team.    OSA (obstructive sleep apnea)    Willa Rough 11/08/2012 7:08 AM

## 2012-11-09 DIAGNOSIS — I5033 Acute on chronic diastolic (congestive) heart failure: Secondary | ICD-10-CM | POA: Diagnosis present

## 2012-11-09 DIAGNOSIS — I5031 Acute diastolic (congestive) heart failure: Secondary | ICD-10-CM

## 2012-11-09 LAB — URINE CULTURE

## 2012-11-09 LAB — BASIC METABOLIC PANEL
CO2: 27 mEq/L (ref 19–32)
Calcium: 8.7 mg/dL (ref 8.4–10.5)
Creatinine, Ser: 0.85 mg/dL (ref 0.50–1.10)
GFR calc non Af Amer: 64 mL/min — ABNORMAL LOW (ref >90)
Glucose, Bld: 120 mg/dL — ABNORMAL HIGH (ref 70–99)

## 2012-11-09 MED ORDER — POTASSIUM CHLORIDE CRYS ER 20 MEQ PO TBCR
20.0000 meq | EXTENDED_RELEASE_TABLET | Freq: Two times a day (BID) | ORAL | Status: AC
  Administered 2012-11-09 (×2): 20 meq via ORAL
  Filled 2012-11-09 (×2): qty 1

## 2012-11-09 MED ORDER — FUROSEMIDE 10 MG/ML IJ SOLN
40.0000 mg | Freq: Once | INTRAMUSCULAR | Status: AC
  Administered 2012-11-09: 40 mg via INTRAVENOUS
  Filled 2012-11-09 (×2): qty 4

## 2012-11-09 NOTE — Progress Notes (Signed)
Pt complaining of some pressure in her chest. Pt VSS with hr-80, bp-153/73, r-22, O2-93%, t-98.9. RN will give pt dilaudid 1mg  IV and Page MD on call to await further orders. Pt rates pain only at a 5/10. RN will continue to monitor pt.

## 2012-11-09 NOTE — Progress Notes (Signed)
[facility] PROGRESS NOTE  [name] ZOX:[id number] DOB: [birth year] DOA: 11/05/2012 PCP: No primary provider on file.  PCP is Dr. Esther Hardy, [facility].  Fax:  [phone] Cardiologist:  Dr. Gerrianne Scale   Patient is a 79 year old female with history of hypertension, hypothyroidism, arthritis presented to [facility] ED with abdominal pain. History was obtained from the patient and her family present in the room. Patient reported having epigastric pain which started yesterday, radiating to right upper quadrant and left upper quadrant, 8/10, sharp with nausea. She did not have any vomiting however had to try eating and unable to hold anything down. She did have constipation but last bowel movement was today which did not improve her pain. ED workup showed lipase more than 3000, CT of the abdomen and pelvis showing possibly acute pancreatitis with apparent inflammatory process in right upper quadrant of the abdomen with ascites and soft tissue edema surrounding the proximal duodenum and right colon.  Assessment/Plan:  SIRS secondary to acute pancreatitis -Resolved.    Acute pancreatitis -Uncertain etiology:  No gallstones, no alcohol, Lipids are normal, calcium not elevated, no new meds. (? If statins would cause pancreatitis)  May need MRCP in a few days to rule out pancreas divisum. Possible low flow state (from hypotension). -Lipase decreased to 193 on 4/10 -Still with mild pain and nausea but tolerating solid diet. -Not requiring pain medication. -Patient will need follow up CT scan in 1 month at her primary care physician's discretion to ensure the pancreatitis has resolved.  Acute on chronic diastolic heart failure exacerbation in the setting of HOCM - At rest on room air patient's O2 sat 82-87%. Ambulated in hall on 3L patient's O2 sat 88-91% - BNP 2600+ on 4/10 - long term SOB with exertion at home. - Appreciate [place]  [facility] consultation.  Per Cards patient unsafe to discharge home today due to significant pleural effusions. - Will continue with very careful diuresis.  Per cardiology patient's with HOCM should stay "on the wet side" - Patient sees Dr. Christell Constant in [place]  Hypertension -Reasonably controlled. -Diltiazem 180 mg q day -Toprol xl 100 mg q day  Hypothyroidism -Started Synthroid approximately 6-9 months ago. -On synthroid 25 mcg  Hypertrophic Obstructive Cardiomyopathy -?ST depression on EKG.  Repeat EKG appeared normal. -Troponins x 3 negative -Echo result EF of 65-70 with grade 1 diastolic dysfunction. Findings consistent with hypertrophic obstructive cardiomyopathy with severe LV -Outflow obstruction at rest. -Patient on BB and Diltiazem -Will follow with primary cardiologist in [facility] after D/C  Unsteady Gait -PT eval Completed -Home Health PT ordered.  Obstructive Sleep Apnea -CPAP QHS ordered -Case manager assisting with obtaining Bipap at home.  Mild Hypokalemia Secondary to lasix use Replete,. ambmet  DVT Prophylaxis:  lovenox  Code Status: full Family Communication: Son Scientist, product/process development at bedside Disposition Plan: Inpatient. Hopefully will D/C 4/12 if breathing improved.   Consultants:  Procedures:  Echo Study Conclusions  - Left ventricle: The cavity size was normal. There was mild asymmetric hypertrophy of the septum, consistent with hypertrophic cardiomyopathy. Systolic function was vigorous. The estimated ejection fraction was in the range of 65% to 70%. There was dynamic obstruction at restin the outflow tract, with a peak velocity of 500cm/sec and a peak gradient of Hg. Wall motion was normal; there were no regional wall motion abnormalities. Doppler parameters are consistent with abnormal left ventricular relaxation (grade 1 diastolic dysfunction). Doppler parameters are consistent with elevated mean left atrial filling  pressure. - Aortic valve: Mid  systolic pre-closure was observed, consistent with dynamic subvalvular stenosis. - Mitral valve: Calcified annulus. There was severe systolic anterior motion of the anterior leaflet. Mild to moderate mid-to-late systolic regurgitation directed eccentrically and posteriorly, due to anterior leaflet systolic anterior motion. - Left atrium: The atrium was mildly dilated. - Atrial septum: No defect or patent foramen ovale was identified. - Pulmonary arteries: Systolic pressure was moderately increased. PA peak pressure: 57mm Hg (S). Impressions: - Findings are consistent with hypertrophic obstructive cardiomyopathy with severe LV outflow obstruction at rest.    Antibiotics:    HPI/Subjective: Tolerated clears with some pain last night.  Not asking for pain medications.  Will try solids today.  Had 2-3 loose stools. Still SOB.  Objective: Filed Vitals:   11/08/12 1412 11/08/12 2124 11/09/12 0111 11/09/12 0500  BP: 145/61 131/70 153/73 135/73  Pulse: 81 81 80 81  Temp: 98.8 F (37.1 C) 99 F (37.2 C) 98.9 F (37.2 C) 98.9 F (37.2 C)  TempSrc: Oral Oral Axillary Oral  Resp: 20 22 22 20   Height:      Weight:      SpO2: 94% 93% 93% 92%    Intake/Output Summary (Last 24 hours) at 11/09/12 1151 Last data filed at 11/09/12 0944  Gross per 24 hour  Intake    320 ml  Output    900 ml  Net   -580 ml   Filed Weights   11/05/12 0919  Weight: 78.019 kg (172 lb)    Exam:   General:  A&O, NAD, Lying comfortably in bed, , extremely pleasant. More talkative today.  Cardiovascular: RRR, systolic murmur 2/6 , rubs or gallops, no lower extremity edema  Respiratory: CTA, no wheeze, crackles, or rales.  No increased work of breathing.  Breaths are short and clipped.  Abdomen: Soft, non tender, non-distended, + bowel sounds, no masses  Musculoskeletal: Able to move all 4 extremities, 5/5 strength in each  Data Reviewed: Basic Metabolic Panel:  Recent Labs Lab 11/05/12 1015  11/05/12 1736 11/06/12 0530 11/07/12 0530 11/09/12 0655  NA 138  --  140 143 141  K 3.7  --  4.1 3.6 3.2*  CL 101  --  108 112 105  CO2 23  --  22 20 27   GLUCOSE 133*  --  97 99 120*  BUN 14  --  20 18 15   CREATININE 0.90 0.96 1.36* 0.96 0.85  CALCIUM 9.5  --  7.7* 7.8* 8.7   Liver Function Tests:  Recent Labs Lab 11/05/12 1015 11/06/12 0530  AST 49* 34  ALT 37* 25  ALKPHOS 91 68  BILITOT 0.8 0.6  PROT 8.1 6.3  ALBUMIN 3.9 3.0*    Recent Labs Lab 11/05/12 1015 11/06/12 0530 11/08/12 1130  LIPASE >3000* 2395* 193*    CBC:  Recent Labs Lab 11/05/12 1015 11/05/12 1736 11/06/12 0530 11/07/12 0530 11/08/12 1130  WBC 11.4* 11.7* 14.3* 10.9* 9.7  NEUTROABS 9.0*  --   --   --   --   HGB 15.5* 14.0 12.6 11.5* 12.2  HCT 45.0 40.6 37.2 33.9* 35.2*  MCV 79.2 80.2 80.5 81.1 80.5  PLT 224 140* 200 181 218   Cardiac Enzymes:  Recent Labs Lab 11/05/12 1021 11/05/12 1737 11/05/12 2247 11/06/12 0530  TROPONINI <0.30 <0.30 <0.30 <0.30     Studies: Dg Chest 2 View  11/08/2012  *RADIOLOGY REPORT*  Clinical Data: Evaluate for congestive heart failure.  CHEST - 2 VIEW  Comparison:  Chest x-ray 11/06/2012.  Findings: Lung volumes are slightly low.  There are bibasilar opacities most compatible with moderate bilateral pleural effusions with superimposed passive atelectasis.  No evidence of pulmonary edema.  Heart size appears to be within normal limits but is partially obscured.  Upper mediastinal contours are within normal limits.  Atherosclerosis in the thoracic aorta.  IMPRESSION: 1.  No findings to suggest congestive heart failure. Pulmonary venous congestion noted on the prior study has resolved on today's examination. 2.  There are moderate bilateral pleural effusions and probable bibasilar passive atelectasis.   Original Report Authenticated By: Trudie Reed, M.D.     Scheduled Meds: . aspirin  81 mg Oral Daily  . diltiazem  180 mg Oral Daily  . enoxaparin  (LOVENOX) injection  40 mg Subcutaneous Q24H  . levothyroxine  25 mcg Oral QAC breakfast  . metoprolol succinate  100 mg Oral Daily  . pantoprazole  40 mg Oral BID  . potassium chloride  20 mEq Oral BID  . simethicone  160 mg Oral QID   Continuous Infusions:    Principal Problem:   Pancreatitis, acute Active Problems:   HTN (hypertension)   Abdominal pain, epigastric, RUQ, LUQ   Nonspecific changes on EKG   Unspecified hypothyroidism   HOCM (hypertrophic obstructive cardiomyopathy)   OSA (obstructive sleep apnea)   Acute diastolic heart failure   Acute on chronic diastolic heart failure    Conley Canal  [facility] Pager [phone]. If 7PM-7AM, please contact night-coverage at [email or web address], password [facility] 11/09/2012, 11:51 AM  LOS: 4 days    Attending - Patient seen and examined, the with the above assessment and plan. She is slowly improving, cardiology diuretic and diuretic therapy. In terms of pericarditis, she is tolerating a low-fat diet. We will continue with current care  S Shakeya Kerkman

## 2012-11-09 NOTE — Progress Notes (Signed)
Pt is no longer having chest pain. Pt is in no apparent distress. RN will continue to monitor pt.

## 2012-11-09 NOTE — Progress Notes (Signed)
   Subjective:  Denies CP or dyspnea; mild abdominal pain   Objective:  Filed Vitals:   11/08/12 1412 11/08/12 2124 11/09/12 0111 11/09/12 0500  BP: 145/61 131/70 153/73 135/73  Pulse: 81 81 80 81  Temp: 98.8 F (37.1 C) 99 F (37.2 C) 98.9 F (37.2 C) 98.9 F (37.2 C)  TempSrc: Oral Oral Axillary Oral  Resp: 20 22 22 20   Height:      Weight:      SpO2: 94% 93% 93% 92%    Intake/Output from previous day:  Intake/Output Summary (Last 24 hours) at 11/09/12 0719 Last data filed at 11/08/12 1800  Gross per 24 hour  Intake    300 ml  Output    700 ml  Net   -400 ml    Physical Exam: Physical exam: Well-developed well-nourished in no acute distress.  Skin is warm and dry.  HEENT is normal.  Neck is supple.  Chest with diminished BS bases bilaterally Cardiovascular exam is regular rate and rhythm. 3/6 systolic murmur Abdominal exam mild tenderness to palpation Extremities show no edema. neuro grossly intact    Lab Results: Basic Metabolic Panel:  Recent Labs  04/54/09 0530  NA 143  K 3.6  CL 112  CO2 20  GLUCOSE 99  BUN 18  CREATININE 0.96  CALCIUM 7.8*   CBC:  Recent Labs  11/07/12 0530 11/08/12 1130  WBC 10.9* 9.7  HGB 11.5* 12.2  HCT 33.9* 35.2*  MCV 81.1 80.5  PLT 181 218     Assessment/Plan:  1 volume excess-the patient has diminished breath sounds at the bases. Chest x-ray shows pleural effusions. Will give Lasix 40 mg IV x1 today. Check potassium and renal function tomorrow morning. 2 hypertrophic obstructive cardiomyopathy-continue beta blocker and calcium blocker. 3 pancreatitis-improving. Management per primary care. 4 hypertension-continue present medications.  Olga Millers 11/09/2012, 7:19 AM

## 2012-11-09 NOTE — Progress Notes (Addendum)
At rest on room air patient's O2 sat 82-87%. Ambulated in hall on 3L patient's O2 sat 88-91%. Returned to room at 3L patient's O2 sat 88-90%.  Reinhart Saulters Consuella Lose 11/09/2012  10:06 AM

## 2012-11-10 DIAGNOSIS — G4733 Obstructive sleep apnea (adult) (pediatric): Secondary | ICD-10-CM

## 2012-11-10 DIAGNOSIS — I5033 Acute on chronic diastolic (congestive) heart failure: Secondary | ICD-10-CM

## 2012-11-10 LAB — BASIC METABOLIC PANEL
CO2: 26 mEq/L (ref 19–32)
Calcium: 9 mg/dL (ref 8.4–10.5)
Creatinine, Ser: 0.88 mg/dL (ref 0.50–1.10)
Glucose, Bld: 123 mg/dL — ABNORMAL HIGH (ref 70–99)

## 2012-11-10 MED ORDER — POTASSIUM CHLORIDE ER 10 MEQ PO TBCR: 20.0000 meq | EXTENDED_RELEASE_TABLET | Freq: Every day | ORAL | Status: DC

## 2012-11-10 MED ORDER — FUROSEMIDE 20 MG PO TABS: 20.0000 mg | ORAL_TABLET | Freq: Every day | ORAL | Status: DC

## 2012-11-10 MED ORDER — FUROSEMIDE 10 MG/ML IJ SOLN
40.0000 mg | Freq: Once | INTRAMUSCULAR | Status: AC
  Administered 2012-11-10: 40 mg via INTRAVENOUS

## 2012-11-10 NOTE — Progress Notes (Signed)
Walked with patient entire length of hallway with portable 02 2lpm n/c, sats 91-92%.  Patient needed to stop twice due to shortness of breath.  Walked entire length of hallway again back to room without 02, sats 88 to 89%, patient again was short of breath and had to stop and rest twice.  Otherwise gait was steady and patient voiced felt good.  Returned to room and placed back on 02 in her room.  Dr Jerral Ralph notified, spoke with family that he will discharge her today with home 02, she has a full tank in her room as well as her cpap.  Bonney Leitz RN

## 2012-11-10 NOTE — Progress Notes (Signed)
Patient tele, cont pulse ox and IV d/c without problem.  Alert and oriented, family took equipment to car prior to patient leaving.  Discharge instructions discussed, copy given with Rx X 2.  Patients skin intact, able to dress self.  Continued to use 02 2lpm n/c.  Via w/c patient escorted out to meet family for d/c home.  Bonney Leitz RN

## 2012-11-10 NOTE — Discharge Summary (Signed)
PATIENT DETAILS Name: Elizabeth Shields Age: 79 y.o. Sex: female Date of Birth: Dec 26, 1931 MRN: [id number]. Admit Date: 11/05/2012 Admitting Physician: Alba Cory, MD PCP:No primary provider on file.  Recommendations for Outpatient Follow-up:  1. Please repeat CXR in 1 week 2. Will need GI referral for possible EUS for evaluation of Pancreatitis  PRIMARY DISCHARGE DIAGNOSIS:  Principal Problem:   Pancreatitis, acute Active Problems:   HTN (hypertension)   Abdominal pain, epigastric, RUQ, LUQ   Nonspecific changes on EKG   Unspecified hypothyroidism   HOCM (hypertrophic obstructive cardiomyopathy)   OSA (obstructive sleep apnea)   Acute diastolic heart failure   Acute on chronic diastolic heart failure      PAST MEDICAL HISTORY: Past Medical History  Diagnosis Date  . Hypertension   . Thyroid disease   . Arthritis   . Hypothyroidism   . Anxiety   . Sleep apnea   . Borderline diabetes   . GERD (gastroesophageal reflux disease)   . HOCM (hypertrophic obstructive cardiomyopathy)     a. Dx 2011 by Dr. Launa Flight [facility];  b. 10/2012 Echo: EF 65-70% w/ dynamic obstruction @ rest in the outflow tract w/ peak velocity of 500 cm/sec & peak gradient of , nl wall motion, Gr 1 DD, sev SAM of the ant leaflet, mild to mod MR,pasp .     DISCHARGE MEDICATIONS:   Medication List    TAKE these medications       acetaminophen 500 MG tablet  Commonly known as:  TYLENOL  Take 500 mg by mouth every 6 (six) hours as needed for pain.     ALPRAZolam 0.5 MG tablet  Commonly known as:  XANAX  0.5 mg 2 (two) times daily.     aspirin 81 MG tablet  Take 81 mg by mouth daily.     CALTRATE 600+D 600-400 MG-UNIT per tablet  Generic drug:  Calcium Carbonate-Vitamin D  Take 1 tablet by mouth 2 (two) times daily.     dicyclomine 10 MG capsule  Commonly known as:  BENTYL  Take 10 mg by mouth 2 (two) times daily.     diltiazem 180 MG 24 hr  capsule  Commonly known as:  TIAZAC  Take 180 mg by mouth daily.     furosemide 20 MG tablet  Commonly known as:  LASIX  Take 1 tablet (20 mg total) by mouth daily.     levothyroxine 25 MCG tablet  Commonly known as:  SYNTHROID, LEVOTHROID  Take 25 mcg by mouth daily before breakfast.     metoprolol succinate 100 MG 24 hr tablet  Commonly known as:  TOPROL-XL  Take 100 mg by mouth daily. Take with or immediately following a meal.     ONE-A-DAY WOMENS FORMULA PO  Take 1 tablet by mouth daily.     pantoprazole 40 MG tablet  Commonly known as:  PROTONIX  Take 40 mg by mouth daily.     potassium chloride 10 MEQ tablet  Commonly known as:  K-DUR  Take 2 tablets (20 mEq total) by mouth daily.     pravastatin 10 MG tablet  Commonly known as:  PRAVACHOL  Take 10 mg by mouth daily.     raloxifene 60 MG tablet  Commonly known as:  EVISTA  Take 60 mg by mouth daily.         BRIEF HPI:  See H&P, Labs, Consult and Test reports for all details in brief, patient is a 79 year old  female with a past medical history of hypertrophic obstructive cardiomyopathy, hypertension, hypothyroidism who presented with abdominal pain. She was found to have acute pancreatitis and admitted to the hospitalist service for further evaluation and treatment.  CONSULTATIONS:   cardiology  PERTINENT RADIOLOGIC STUDIES: Dg Chest 2 View  11/08/2012  *RADIOLOGY REPORT*  Clinical Data: Evaluate for congestive heart failure.  CHEST - 2 VIEW  Comparison: Chest x-ray 11/06/2012.  Findings: Lung volumes are slightly low.  There are bibasilar opacities most compatible with moderate bilateral pleural effusions with superimposed passive atelectasis.  No evidence of pulmonary edema.  Heart size appears to be within normal limits but is partially obscured.  Upper mediastinal contours are within normal limits.  Atherosclerosis in the thoracic aorta.  IMPRESSION: 1.  No findings to suggest congestive heart failure.  Pulmonary venous congestion noted on the prior study has resolved on today's examination. 2.  There are moderate bilateral pleural effusions and probable bibasilar passive atelectasis.   Original Report Authenticated By: Trudie Reed, M.D.    US Abdomen Complete  11/05/2012  *RADIOLOGY REPORT*  Clinical Data:  Acute pancreatitis.  Assess for underlying gallstones or cholecystitis.  ABDOMINAL ULTRASOUND COMPLETE  Comparison:  CT of the abdomen and pelvis performed earlier today at 01:01 p.m.  Findings:  Gallbladder:  The gallbladder appears mildly distended.  It demonstrates multiple folds, with some degree of tortuosity, but is otherwise unremarkable in appearance.  No gallbladder wall thickening or pericholecystic fluid is seen.  No ultrasonographic Murphy's sign is elicited.  Common Bile Duct:  0.8 cm in diameter; within normal limits in caliber, given the patient's age.  Liver:  Normal parenchymal echogenicity and echotexture; apparent 2.2 x 2.1 x 1.2 cm hypoechoic cyst is again noted at the left hepatic lobe.  Limited Doppler evaluation demonstrates normal blood flow within the liver.  A small amount of ascites is noted about the liver and spleen, as on the recent CT.  IVC:  Unremarkable in appearance.  Pancreas: The patient's known mild pancreatitis is not well assessed on ultrasound. The common bile duct measures 0.8 cm at the head of the pancreas, borderline normal in size.  Spleen:  7.1 cm in length; within normal limits in size and echotexture.  Right kidney:  9.4 cm in length; normal in size, configuration and parenchymal echogenicity.  No evidence of mass or hydronephrosis. Mild fetal lobulations are suggested.  Left kidney:  9.0 cm in length; normal in size, configuration and parenchymal echogenicity.  No evidence of mass or hydronephrosis. Mild fetal lobulations are suggested.  Abdominal Aorta:  Normal in caliber; no aneurysm identified.  Not visualized at the level of the bifurcation due to  overlying bowel gas.  IMPRESSION:  1.  Known mild pancreatitis not well assessed on ultrasound. 2.  Mild apparent gallbladder distension; the gallbladder is somewhat tortuous but otherwise unremarkable in appearance.  No evidence for obstruction or cholecystitis.  Common hepatic duct and common bile duct remain borderline normal in size for the patient's age.  No gallstones identified. 3.  Left hepatic cyst again noted. 4.  Small amount of ascites noted about the liver and spleen, as on the recent CT.   Original Report Authenticated By: Tonia Ghent, M.D.    Ct Abdomen Pelvis W Contrast  11/05/2012  **ADDENDUM** CREATED: 11/05/2012 13:30:01  In further discussion with Dr. Rubin Payor and in light of a significantly elevated serum lipase level, these findings are probably secondary to acute pancreatitis.  **END ADDENDUM** SIGNED BY: Gerrianne Scale, M.D.  11/05/2012  *RADIOLOGY REPORT*  Clinical Data: Diffuse abdominal pain with nausea and slight diarrhea.  CT ABDOMEN AND PELVIS WITH CONTRAST  Technique:  Multidetector CT imaging of the abdomen and pelvis was performed following the standard protocol during bolus administration of intravenous contrast.  Contrast: 50mL OMNIPAQUE IOHEXOL 300 MG/ML  SOLN, OMNIPAQUE IOHEXOL 300 MG/ML  SOLN  Comparison: Acute abdominal series same date.  Findings: There is a small right pleural effusion with associated right lower lobe atelectasis.  The lung bases are otherwise clear. Faint coronary calcifications are noted.  There is a moderate amount of perihepatic ascites.  Fluid tracks along the right paracolic gutter into the pelvis.  There is minimal fluid surrounding the gallbladder, and no significant gallbladder wall thickening.  Edema surrounds the proximal duodenum.  There is no duodenal or colonic wall thickening.  There is no extravasated enteric contrast.  The pancreas demonstrates normal density and enhancement.  There is no peripancreatic fluid collection or  biliary dilatation.  There are several well circumscribed low density hepatic lesions most consistent with cysts.  The largest of these is in the lateral segment left hepatic lobe, measuring 2.5 cm on image 19.  The spleen and adrenal glands appear normal.  There are tiny low density right renal lesions.  Although there is mildly asymmetric perinephric soft tissue stranding on the right, no primary renal process is identified.  The stomach is incompletely distended.  The small bowel and colon within the pelvis appear normal.  The cecum is situated within the right mid abdomen.  The appendix demonstrates no definite inflammation.  There is mild aorto iliac atherosclerosis.  The bladder, uterus and ovaries appear normal.  The lower lumbar spine degenerative changes are present with a grade 1 anterolisthesis at L3-L4 and L4-L5.  No acute osseous findings are seen.  IMPRESSION:  1.  There is an apparent inflammatory process in the right upper quadrant of the abdomen with ascites and soft tissue edema surrounding the proximal duodenum and right colon. The source for this is not clearly demonstrated and is not felt to be related to the gallbladder, pancreas or appendix. 2.  No evidence of extravasated enteric contrast, bowel obstruction or drainable abscess. 3.  No significant solid parenchymal organ findings. 4.  Small right pleural effusion and right lower lobe atelectasis.   Original Report Authenticated By: Carey Bullocks, M.D.    Dg Chest Port 1 View  11/06/2012  *RADIOLOGY REPORT*  Clinical Data: Hypoxemia.  PORTABLE CHEST - 1 VIEW 11/06/2012 1324 hours:  Comparison: One-view abdomen x-ray yesterday.  Findings: Interval worsening of airspace consolidation in the lower lobes with possible bilateral pleural effusions.  Cardiac silhouette upper normal in size for technique and degree of inspiration.  Mild pulmonary venous hypertension without overt edema, increased since yesterday.  IMPRESSION: Worsening atelectasis  and/or pneumonia in the lower lobes. Probable bilateral pleural effusions.  Developing pulmonary venous hypertension without overt edema currently, query incipient fluid overload.   Original Report Authenticated By: Hulan Saas, M.D.    Dg Abd Acute W/chest  11/05/2012  *RADIOLOGY REPORT*  Clinical Data: Abdominal pain with nausea since yesterday.  ACUTE ABDOMEN SERIES (ABDOMEN 2 VIEW & CHEST 1 VIEW)  Comparison: None.  Findings: There are low lung volumes with patchy right greater than left basilar pulmonary opacities.  No significant pleural effusion is identified.  Heart size and mediastinal contours are normal. Scattered osteophytes of the thoracic spine are noted.  The bowel gas pattern is normal.  There is  no free intraperitoneal air.  There are no suspicious calcifications.  Small pelvic calcifications on the left are probably phleboliths.  There is a mild convex right lumbar scoliosis.  IMPRESSION:  1.  No acute abdominal findings identified. 2.  Low lung volumes with probable bibasilar atelectasis.   Original Report Authenticated By: Carey Bullocks, M.D.      PERTINENT LAB RESULTS: CBC:  Recent Labs  11/08/12 1130  WBC 9.7  HGB 12.2  HCT 35.2*  PLT 218   CMET CMP     Component Value Date/Time   NA 137 11/10/2012 0700   K 3.5 11/10/2012 0700   CL 102 11/10/2012 0700   CO2 26 11/10/2012 0700   GLUCOSE 123* 11/10/2012 0700   BUN 18 11/10/2012 0700   CREATININE 0.88 11/10/2012 0700   CALCIUM 9.0 11/10/2012 0700   PROT 6.3 11/06/2012 0530   ALBUMIN 3.0* 11/06/2012 0530   AST 34 11/06/2012 0530   ALT 25 11/06/2012 0530   ALKPHOS 68 11/06/2012 0530   BILITOT 0.6 11/06/2012 0530   GFRNONAA 61* 11/10/2012 0700   GFRAA 71* 11/10/2012 0700    GFR Estimated Creatinine Clearance: 52.1 ml/min (by C-G formula based on Cr of 0.88).  Recent Labs  11/08/12 1130  LIPASE 193*   No results found for this basename: CKTOTAL, CKMB, CKMBINDEX, TROPONINI,  in the last 72 hours No components found with  this basename: POCBNP,  No results found for this basename: DDIMER,  in the last 72 hours No results found for this basename: HGBA1C,  in the last 72 hours No results found for this basename: CHOL, HDL, LDLCALC, TRIG, CHOLHDL, LDLDIRECT,  in the last 72 hours No results found for this basename: TSH, T4TOTAL, FREET3, T3FREE, THYROIDAB,  in the last 72 hours No results found for this basename: VITAMINB12, FOLATE, FERRITIN, TIBC, IRON, RETICCTPCT,  in the last 72 hours Coags: No results found for this basename: PT, INR,  in the last 72 hours Microbiology: Recent Results (from the past 240 hour(s))  URINE CULTURE     Status: None   Collection Time    11/06/12  1:35 PM      Result Value Range Status   Specimen Description URINE, CLEAN CATCH   Final   Special Requests NONE   Final   Culture  Setup Time 11/06/2012 14:04   Final   Colony Count 90,000 COLONIES/ML   Final   Culture PSEUDOMONAS AERUGINOSA   Final   Report Status 11/09/2012 FINAL   Final   Organism ID, Bacteria PSEUDOMONAS AERUGINOSA   Final     BRIEF HOSPITAL COURSE:   Principal Problem:   Pancreatitis, acute -Acute pancreatitis  -Uncertain etiology: No gallstones, no alcohol, Lipids are normal, calcium not elevated, no new meds. (? If statins would cause pancreatitis)  - She was admitted, given IV fluids and placed n.p.o. Her diet was slowly advanced and by the day of discharge she was tolerating a low-fat diet. - She's also not requiring any pain medications. - Patient's primary care practitioner primary cardiologist are with the [facility], family desires that she followup with her doctors there. I have instructed and advised the family that they get in touch with a gastroenterologist within [facility] health system, so that the patient can get a endoscopy ultrasound at some point in the near future. Patient has been instructed to followup with her primary care doctor in one week.-  Active Problems: Acute on chronic  diastolic heart failure - Patient has underlying  hypertrophic obstructive cardiomyopathy - On admission she was given IV fluids-likely precipitating acute diastolic heart failure - Because of HOCM- radiology was consulted and patient was gently diuresed. Over the course of the past 3 days, she has done significantly better. Her oxygen requirements are significantly down as well. To 3 days ago she was requiring 4 L of oxygen to maintain O2 saturations, now she is down to down to just 1 or 2 L. - On ambulation on room air O2 saturations were at 80%. She will be discharged home on oxygen he did hopefully she can be quickly weaned off it. - She was seen by [facility] cardiology today-Dr. Dietrich Pates suggested that we discharge her on furosemide 20 mg daily for 5 days, I have instructed the family to check her weights daily and to take her to her primary cardiologist in [place] within one week for followup. They have claimed understanding.  Hypertension  -Reasonably controlled.  -Diltiazem 180 mg q day  -Toprol xl 100 mg q day   Hypothyroidism  -Started Synthroid approximately 6-9 months ago.  -On synthroid 25 mcg   Obstructive Sleep Apnea  -CPAP QHS ordered  -Case manager assisting with obtaining Bipap at home.   Mild Hypokalemia  -Secondary to lasix use  -Repleted  TODAY-DAY OF DISCHARGE:  Subjective:   Daphane Odekirk today has no headache,no chest abdominal pain,no new weakness tingling or numbness, feels much better wants to go home today.   Objective:   Blood pressure 147/81, pulse 90, temperature 99.2 F (37.3 C), temperature source Oral, resp. rate 20, height 5\' 3"  (1.6 m), weight 78.019 kg (172 lb), SpO2 91.00%.  Intake/Output Summary (Last 24 hours) at 11/10/12 1438 Last data filed at 11/10/12 0609  Gross per 24 hour  Intake    240 ml  Output    950 ml  Net   -710 ml    Exam Awake Alert, Oriented *3, No new F.N deficits, Normal affect [place].AT,PERRAL Supple Neck,No JVD, No  cervical lymphadenopathy appriciated.  Symmetrical Chest wall movement, Good air movement bilaterally, CTAB RRR,No Gallops,Rubs or new Murmurs, No Parasternal Heave +ve B.Sounds, Abd Soft, Non tender, No organomegaly appriciated, No rebound -guarding or rigidity. No Cyanosis, Clubbing or edema, No new Rash or bruise  DISCHARGE CONDITION: Stable  DISPOSITION: HOME  DISCHARGE INSTRUCTIONS:    Activity:  As tolerated with Full fall precautions use walker/cane & assistance as needed  Diet recommendation: Heart healthy and a low-fat diet        Follow-up Information   Follow up with Primary Care MD-Dr Craig Staggers. Schedule an appointment as soon as possible for a visit in 1 week.      Follow up with Primary Cardiologist in [place]-Dr Launa Flight. Schedule an appointment as soon as possible for a visit in 1 week.      Total Time spent on discharge equals 45 minutes.  SignedJeoffrey Massed 11/10/2012 2:38 PM

## 2012-11-10 NOTE — Progress Notes (Addendum)
Elizabeth Shields  79 y.o.  female  Subjective: Denies chest pain, dyspnea, orthopnea, PND, nausea, emesis or hematemesis; mild reflux of stomach contents intermittently. She has never apparently previously suffered an episode of congestive heart failure nor any definite symptoms related to her heart disease. Family would like her not to be discharged with oxygen if this can be avoided.  Allergy: Sulfa antibiotics  Objective: Vital signs in last 24 hours: Temp:  [98.6 F (37 C)-99.5 F (37.5 C)] 99.2 F (37.3 C) (04/12 0607) Pulse Rate:  [80-90] 90 (04/12 0607) Resp:  [18-20] 20 (04/12 0607) BP: (133-147)/(70-81) 147/81 mmHg (04/12 0607) SpO2:  [91 %-93 %] 91 % (04/12 0607)  78.019 kg (172 lb) Body mass index is 30.48 kg/(m^2).  Weight change:  Last BM Date: 11/08/12  Intake/Output from previous day:  -2 L 04/11 0701 - 04/12 0700 In: 582 [P.O.:582] Out: 1500 [Urine:1500] Total I/O since admission: -2 L  No weights ordered or recorded.  General- Well developed; no acute distress Neck- No JVD, no carotid bruits Lungs- bronchial breath sounds at both bases with egophony; normal I:E ratio Cardiovascular- normal PMI; normal S1 and S2; grade 3/6 systolic ejection murmur at the left sternal border. Abdomen- normal bowel sounds; soft and non-tender without masses or organomegaly Skin- Warm, no significant lesions Extremities- Nl distal pulses; trace edema  BNP level: 2671 CBC:   Recent Labs  11/08/12 1130  WBC 9.7  HGB 12.2  HCT 35.2*  PLT 218   BMET:  Recent Labs  11/09/12 0655 11/10/12 0700  NA 141 137  K 3.2* 3.5  CL 105 102  CO2 27 26  GLUCOSE 120* 123*  BUN 15 18  CREATININE 0.85 0.88  CALCIUM 8.7 9.0   EKG: Tracing of 11/05/12 reviewed. Normal sinus rhythm; LVH with repolarization abnormality; delayed R-wave progression. No previous tracing for comparison.  Imaging Studies/Results: No results found.  Imaging: Imaging results have been  reviewed  Medications:  I have reviewed the patient's current medications. Scheduled: . aspirin  81 mg Oral Daily  . diltiazem  180 mg Oral Daily  . enoxaparin (LOVENOX) injection  40 mg Subcutaneous Q24H  . furosemide  40 mg Intravenous Once  . levothyroxine  25 mcg Oral QAC breakfast  . metoprolol succinate  100 mg Oral Daily  . pantoprazole  40 mg Oral BID  . simethicone  160 mg Oral QID  Assessment/Plan: HOCM: Patient has been virtually asymptomatic despite an impressive LVOT gradient on echocardiogram.  Continue treatment with beta blocker and diltiazem is appropriate. Genetics of this condition discussed with patient and her family with recommendation for first-degree relatives to have echocardiography if not previously performed. Interestingly, 3 close relatives have required pacing.  CHF: Bilateral pleural effusions with moderate elevation of BNP level.  Likely related to fluid overload, but sympathetic effusion related to pancreatitis is possible. She had good response to single dose of intravenous furosemide yesterday, and this will be repeated. Ambulation with oximetry will be performed. If she maintains O2 saturation, she'll probably not need home oxygen therapy.  Borderline hypokalemia: Replacement therapy will be provided.  Discharge today can be considered. I recommend a 5 day course of furosemide 20 mg per day with monitoring of weights at home and a return visit with her cardiologist for repeat chest x-ray in one week.   LOS: 5 days   Elizabeth Shields 11/10/2012, 8:58 AM

## 2013-05-18 ENCOUNTER — Emergency Department (HOSPITAL_BASED_OUTPATIENT_CLINIC_OR_DEPARTMENT_OTHER): Payer: Medicare Other

## 2013-05-18 ENCOUNTER — Encounter (HOSPITAL_BASED_OUTPATIENT_CLINIC_OR_DEPARTMENT_OTHER): Payer: Self-pay | Admitting: Emergency Medicine

## 2013-05-18 ENCOUNTER — Inpatient Hospital Stay (HOSPITAL_BASED_OUTPATIENT_CLINIC_OR_DEPARTMENT_OTHER)
Admission: EM | Admit: 2013-05-18 | Discharge: 2013-05-21 | DRG: 418 | Disposition: A | Discharge status: Home / Self Care | Payer: Medicare Other | Attending: General Surgery | Admitting: General Surgery

## 2013-05-18 DIAGNOSIS — R7309 Other abnormal glucose: Secondary | ICD-10-CM | POA: Diagnosis present

## 2013-05-18 DIAGNOSIS — K529 Noninfective gastroenteritis and colitis, unspecified: Secondary | ICD-10-CM

## 2013-05-18 DIAGNOSIS — Z7982 Long term (current) use of aspirin: Secondary | ICD-10-CM

## 2013-05-18 DIAGNOSIS — I509 Heart failure, unspecified: Secondary | ICD-10-CM | POA: Diagnosis present

## 2013-05-18 DIAGNOSIS — N390 Urinary tract infection, site not specified: Secondary | ICD-10-CM

## 2013-05-18 DIAGNOSIS — E669 Obesity, unspecified: Secondary | ICD-10-CM | POA: Diagnosis present

## 2013-05-18 DIAGNOSIS — F411 Generalized anxiety disorder: Secondary | ICD-10-CM | POA: Diagnosis present

## 2013-05-18 DIAGNOSIS — K8070 Calculus of gallbladder and bile duct without cholecystitis without obstruction: Secondary | ICD-10-CM | POA: Diagnosis present

## 2013-05-18 DIAGNOSIS — K859 Acute pancreatitis without necrosis or infection, unspecified: Principal | ICD-10-CM | POA: Diagnosis present

## 2013-05-18 DIAGNOSIS — E785 Hyperlipidemia, unspecified: Secondary | ICD-10-CM | POA: Diagnosis present

## 2013-05-18 DIAGNOSIS — I421 Obstructive hypertrophic cardiomyopathy: Secondary | ICD-10-CM | POA: Diagnosis present

## 2013-05-18 DIAGNOSIS — R7401 Elevation of levels of liver transaminase levels: Secondary | ICD-10-CM

## 2013-05-18 DIAGNOSIS — K5289 Other specified noninfective gastroenteritis and colitis: Secondary | ICD-10-CM | POA: Diagnosis present

## 2013-05-18 DIAGNOSIS — E876 Hypokalemia: Secondary | ICD-10-CM | POA: Diagnosis present

## 2013-05-18 DIAGNOSIS — E860 Dehydration: Secondary | ICD-10-CM | POA: Diagnosis present

## 2013-05-18 DIAGNOSIS — Z79899 Other long term (current) drug therapy: Secondary | ICD-10-CM

## 2013-05-18 DIAGNOSIS — Z6825 Body mass index (BMI) 25.0-25.9, adult: Secondary | ICD-10-CM

## 2013-05-18 DIAGNOSIS — I10 Essential (primary) hypertension: Secondary | ICD-10-CM | POA: Diagnosis present

## 2013-05-18 DIAGNOSIS — E039 Hypothyroidism, unspecified: Secondary | ICD-10-CM | POA: Diagnosis present

## 2013-05-18 DIAGNOSIS — K219 Gastro-esophageal reflux disease without esophagitis: Secondary | ICD-10-CM | POA: Diagnosis present

## 2013-05-18 DIAGNOSIS — G4733 Obstructive sleep apnea (adult) (pediatric): Secondary | ICD-10-CM | POA: Diagnosis present

## 2013-05-18 DIAGNOSIS — R1110 Vomiting, unspecified: Secondary | ICD-10-CM

## 2013-05-18 DIAGNOSIS — I5032 Chronic diastolic (congestive) heart failure: Secondary | ICD-10-CM | POA: Diagnosis present

## 2013-05-18 DIAGNOSIS — R1013 Epigastric pain: Secondary | ICD-10-CM

## 2013-05-18 DIAGNOSIS — I5030 Unspecified diastolic (congestive) heart failure: Secondary | ICD-10-CM

## 2013-05-18 HISTORY — DX: Unspecified diastolic (congestive) heart failure: I50.30

## 2013-05-18 LAB — CBC
Hemoglobin: 12.3 g/dL (ref 12.0–15.0)
MCH: 27.8 pg (ref 26.0–34.0)
MCHC: 34.5 g/dL (ref 30.0–36.0)
MCV: 80.6 fL (ref 78.0–100.0)
Platelets: 215 10*3/uL (ref 150–400)
RBC: 4.43 MIL/uL (ref 3.87–5.11)

## 2013-05-18 LAB — CBC WITH DIFFERENTIAL/PLATELET
Basophils Absolute: 0 10*3/uL (ref 0.0–0.1)
Basophils Relative: 0 % (ref 0–1)
Eosinophils Relative: 1 % (ref 0–5)
HCT: 37.8 % (ref 36.0–46.0)
MCH: 26.9 pg (ref 26.0–34.0)
MCHC: 33.3 g/dL (ref 30.0–36.0)
MCV: 80.6 fL (ref 78.0–100.0)
Monocytes Absolute: 1 10*3/uL (ref 0.1–1.0)
RDW: 15.4 % (ref 11.5–15.5)

## 2013-05-18 LAB — URINALYSIS, ROUTINE W REFLEX MICROSCOPIC
Glucose, UA: NEGATIVE mg/dL
Ketones, ur: 15 mg/dL — AB
Protein, ur: 100 mg/dL — AB

## 2013-05-18 LAB — URINE MICROSCOPIC-ADD ON

## 2013-05-18 LAB — COMPREHENSIVE METABOLIC PANEL
AST: 33 U/L (ref 0–37)
CO2: 24 mEq/L (ref 19–32)
Calcium: 9.7 mg/dL (ref 8.4–10.5)
Creatinine, Ser: 1.2 mg/dL — ABNORMAL HIGH (ref 0.50–1.10)
GFR calc non Af Amer: 42 mL/min — ABNORMAL LOW (ref >90)

## 2013-05-18 MED ORDER — METOPROLOL SUCCINATE ER 100 MG PO TB24
100.0000 mg | ORAL_TABLET | Freq: Every day | ORAL | Status: DC
  Administered 2013-05-19 – 2013-05-20 (×2): 100 mg via ORAL
  Filled 2013-05-18 (×4): qty 1

## 2013-05-18 MED ORDER — ENOXAPARIN SODIUM 40 MG/0.4ML ~~LOC~~ SOLN
40.0000 mg | SUBCUTANEOUS | Status: DC
  Administered 2013-05-18 – 2013-05-20 (×3): 40 mg via SUBCUTANEOUS
  Filled 2013-05-18 (×3): qty 0.4

## 2013-05-18 MED ORDER — ALPRAZOLAM 0.5 MG PO TABS
0.5000 mg | ORAL_TABLET | Freq: Two times a day (BID) | ORAL | Status: DC
  Administered 2013-05-18 – 2013-05-20 (×4): 0.5 mg via ORAL
  Filled 2013-05-18 (×4): qty 1

## 2013-05-18 MED ORDER — IOHEXOL 300 MG/ML  SOLN
50.0000 mL | Freq: Once | INTRAMUSCULAR | Status: AC | PRN
  Administered 2013-05-18: 50 mL via ORAL

## 2013-05-18 MED ORDER — CEFTRIAXONE SODIUM 1 G IJ SOLR
INTRAMUSCULAR | Status: AC
  Filled 2013-05-18: qty 10

## 2013-05-18 MED ORDER — PANTOPRAZOLE SODIUM 40 MG IV SOLR
40.0000 mg | INTRAVENOUS | Status: DC
  Administered 2013-05-18 – 2013-05-20 (×3): 40 mg via INTRAVENOUS
  Filled 2013-05-18 (×3): qty 40

## 2013-05-18 MED ORDER — ONDANSETRON HCL 4 MG/2ML IJ SOLN: 4.0000 mg | Freq: Four times a day (QID) | INTRAMUSCULAR | Status: DC | PRN

## 2013-05-18 MED ORDER — ALUM & MAG HYDROXIDE-SIMETH 200-200-20 MG/5ML PO SUSP: 30.0000 mL | Freq: Four times a day (QID) | ORAL | Status: DC | PRN

## 2013-05-18 MED ORDER — POTASSIUM CHLORIDE CRYS ER 20 MEQ PO TBCR
40.0000 meq | EXTENDED_RELEASE_TABLET | Freq: Once | ORAL | Status: AC
  Administered 2013-05-18: 40 meq via ORAL
  Filled 2013-05-18: qty 2

## 2013-05-18 MED ORDER — DICYCLOMINE HCL 10 MG PO CAPS
10.0000 mg | ORAL_CAPSULE | Freq: Two times a day (BID) | ORAL | Status: DC
  Administered 2013-05-18 – 2013-05-20 (×5): 10 mg via ORAL
  Filled 2013-05-18 (×8): qty 1

## 2013-05-18 MED ORDER — ACETAMINOPHEN 650 MG RE SUPP: 650.0000 mg | Freq: Four times a day (QID) | RECTAL | Status: DC | PRN

## 2013-05-18 MED ORDER — POTASSIUM CHLORIDE 10 MEQ/100ML IV SOLN
10.0000 meq | INTRAVENOUS | Status: AC
  Administered 2013-05-18 – 2013-05-19 (×2): 10 meq via INTRAVENOUS
  Filled 2013-05-18 (×2): qty 100

## 2013-05-18 MED ORDER — HYDROMORPHONE HCL PF 1 MG/ML IJ SOLN: 0.5000 mg | INTRAMUSCULAR | Status: DC | PRN

## 2013-05-18 MED ORDER — IOHEXOL 300 MG/ML  SOLN
100.0000 mL | Freq: Once | INTRAMUSCULAR | Status: AC | PRN
  Administered 2013-05-18: 100 mL via INTRAVENOUS

## 2013-05-18 MED ORDER — SODIUM CHLORIDE 0.9 % IV SOLN
INTRAVENOUS | Status: DC
  Administered 2013-05-18: 23:00:00 via INTRAVENOUS

## 2013-05-18 MED ORDER — ASPIRIN 81 MG PO CHEW
81.0000 mg | CHEWABLE_TABLET | Freq: Every day | ORAL | Status: DC
  Administered 2013-05-19: 81 mg via ORAL
  Filled 2013-05-18 (×3): qty 1

## 2013-05-18 MED ORDER — ONDANSETRON HCL 4 MG PO TABS: 4.0000 mg | ORAL_TABLET | Freq: Four times a day (QID) | ORAL | Status: DC | PRN

## 2013-05-18 MED ORDER — RALOXIFENE HCL 60 MG PO TABS
60.0000 mg | ORAL_TABLET | Freq: Every day | ORAL | Status: DC
  Administered 2013-05-19: 60 mg via ORAL
  Filled 2013-05-18 (×3): qty 1

## 2013-05-18 MED ORDER — DILTIAZEM HCL ER 240 MG PO CP24
240.0000 mg | ORAL_CAPSULE | Freq: Every day | ORAL | Status: DC
  Administered 2013-05-19 – 2013-05-20 (×2): 240 mg via ORAL
  Filled 2013-05-18 (×3): qty 1

## 2013-05-18 MED ORDER — ZOLPIDEM TARTRATE 5 MG PO TABS: 5.0000 mg | ORAL_TABLET | Freq: Every evening | ORAL | Status: DC | PRN

## 2013-05-18 MED ORDER — SODIUM CHLORIDE 0.9 % IV SOLN
INTRAVENOUS | Status: AC
  Administered 2013-05-18: 17:00:00 via INTRAVENOUS

## 2013-05-18 MED ORDER — ONDANSETRON HCL 4 MG/2ML IJ SOLN
4.0000 mg | Freq: Once | INTRAMUSCULAR | Status: AC | PRN
  Administered 2013-05-18: 4 mg via INTRAVENOUS
  Filled 2013-05-18: qty 2

## 2013-05-18 MED ORDER — DEXTROSE 5 % IV SOLN
1.0000 g | Freq: Once | INTRAVENOUS | Status: AC
  Administered 2013-05-18: 1 g via INTRAVENOUS

## 2013-05-18 MED ORDER — POTASSIUM CHLORIDE ER 10 MEQ PO TBCR
20.0000 meq | EXTENDED_RELEASE_TABLET | Freq: Every day | ORAL | Status: DC
  Filled 2013-05-18: qty 2

## 2013-05-18 MED ORDER — OXYCODONE HCL 5 MG PO TABS
5.0000 mg | ORAL_TABLET | ORAL | Status: DC | PRN
  Administered 2013-05-20: 5 mg via ORAL
  Filled 2013-05-18: qty 1

## 2013-05-18 MED ORDER — SODIUM CHLORIDE 0.9 % IV BOLUS (SEPSIS)
1000.0000 mL | Freq: Once | INTRAVENOUS | Status: AC
  Administered 2013-05-18: 1000 mL via INTRAVENOUS

## 2013-05-18 MED ORDER — LEVOTHYROXINE SODIUM 25 MCG PO TABS
25.0000 ug | ORAL_TABLET | Freq: Every day | ORAL | Status: DC
  Administered 2013-05-19 – 2013-05-21 (×3): 25 ug via ORAL
  Filled 2013-05-18 (×4): qty 1

## 2013-05-18 MED ORDER — SIMVASTATIN 5 MG PO TABS
5.0000 mg | ORAL_TABLET | Freq: Every day | ORAL | Status: DC
  Filled 2013-05-18: qty 1

## 2013-05-18 MED ORDER — ACETAMINOPHEN 325 MG PO TABS
650.0000 mg | ORAL_TABLET | Freq: Four times a day (QID) | ORAL | Status: DC | PRN
  Administered 2013-05-19: 650 mg via ORAL
  Filled 2013-05-18: qty 2

## 2013-05-18 NOTE — ED Notes (Signed)
[facility] here and has assumed care of the patient. 

## 2013-05-18 NOTE — H&P (Addendum)
[facility] History and Physical  Elizabeth Shields ZOX:[id number] DOB: 12/09/1931 DOA: 05/18/2013  Referring physician:  EDP PCP: No primary provider on file.  Specialists:   Chief Complaint: ABD Pain Nausea and Vomting  HPI: Elizabeth Shields is a 79 y.o. female who presented to the [facility] ED with complaints of ABD pain, Nausea and vomiting and diarrhea for the past 4 days.    She describes having Diffuse ABD pain.     She reports the symptoms began after she ate a chicken fillet sandwich from [employer].  She denies having any fevers or chills.  She reports feeling better when she vomits, and she has not been able to hold down any food or liquids over the past 2 days.   In the ED she was evaluated and was found to have elevated transaminases and underwent and CT scan of the ABD and Pelvis which found inflammatory changes around the Pancreas,  CBD Duodenum, and hepatic Flexure of the Gallbladder and Colon and also Dilatation of the CBD.    A Bedside ABD [place] study was also performed at the [facility] ED.   She was transferred to [facility] for admission and a GI consultation.        Review of Systems: The patient denies anorexia, fever, chills, headaches, weight loss, vision loss, diplopia, dizziness, decreased hearing, rhinitis, hoarseness, chest pain, syncope, dyspnea on exertion, peripheral edema, balance deficits, cough, hemoptysis, constipation, hematemesis, melena, hematochezia, severe indigestion/heartburn, dysuria, hematuria, incontinence, suspicious skin lesions, transient blindness, difficulty walking, depression, unusual weight change, abnormal bleeding, enlarged lymph nodes, angioedema, and breast masses.    Past Medical History  Diagnosis Date  . Hypertension   . Thyroid disease   . Arthritis   . Hypothyroidism   . Anxiety   . Sleep apnea   . Borderline diabetes   . GERD (gastroesophageal reflux disease)   . HOCM (hypertrophic obstructive cardiomyopathy)     a.  Dx 2011 by Dr. Launa Flight [facility];  b. 10/2012 Echo: EF 65-70% w/ dynamic obstruction @ rest in the outflow tract w/ peak velocity of 500 cm/sec & peak gradient of , nl wall motion, Gr 1 DD, sev SAM of the ant leaflet, mild to mod MR,pasp .     Past Surgical History  Procedure Laterality Date  . Tubal ligation      Prior to Admission medications   Medication Sig Start Date End Date Taking? Authorizing Provider  acetaminophen (TYLENOL) 500 MG tablet Take 500 mg by mouth every 6 (six) hours as needed for pain.   Yes Historical Provider, MD  ALPRAZolam (XANAX) 0.5 MG tablet 0.5 mg 2 (two) times daily.   Yes Historical Provider, MD  aspirin 81 MG tablet Take 81 mg by mouth daily.   Yes Historical Provider, MD  dicyclomine (BENTYL) 10 MG capsule Take 10 mg by mouth 2 (two) times daily.   Yes Historical Provider, MD  diltiazem (DILACOR XR) 240 MG 24 hr capsule Take 240 mg by mouth daily.   Yes Historical Provider, MD  levothyroxine (SYNTHROID, LEVOTHROID) 25 MCG tablet Take 25 mcg by mouth daily before breakfast.   Yes Historical Provider, MD  metoprolol succinate (TOPROL-XL) 100 MG 24 hr tablet Take 100 mg by mouth daily. Take with or immediately following a meal.   Yes Historical Provider, MD  Multiple Vitamins-Calcium (ONE-A-DAY WOMENS FORMULA PO) Take 1 tablet by mouth daily.   Yes Historical Provider, MD  pantoprazole (PROTONIX) 40 MG  tablet Take 40 mg by mouth daily.   Yes Historical Provider, MD  potassium chloride (K-DUR) 10 MEQ tablet Take 2 tablets (20 mEq total) by mouth daily. 11/10/12  Yes Shanker Levora Dredge, MD  pravastatin (PRAVACHOL) 10 MG tablet Take 10 mg by mouth daily.   Yes Historical Provider, MD  raloxifene (EVISTA) 60 MG tablet Take 60 mg by mouth daily.   Yes Historical Provider, MD    Allergies  Allergen Reactions  . Sulfa Antibiotics Nausea And Vomiting    Social History:  reports that she has never smoked. She has never used  smokeless tobacco. She reports that she does not drink alcohol or use illicit drugs.     Family History  Problem Relation Age of Onset  . Heart disease      mother, father, 4 siblings - pt unaware of details     Thyroid disease (Goiter) in Mother     Seizures in Mother   Physical Exam: Obese 48 year old Elderly African American Female examined  and in no acute distress; cooperative with exam  Filed Vitals:   05/18/13 1208 05/18/13 1230 05/18/13 1602 05/18/13 2110  BP: 124/54 131/51 147/60 133/56  Pulse: 63 63  73  Temp:   98.2 F (36.8 C) 98.1 F (36.7 C)  TempSrc:   Oral Oral  Resp:   20 20  Height:      Weight:      SpO2: 97% 95% 96% 95%   Blood pressure 133/56, pulse 73, temperature 98.1 F (36.7 C), temperature source Oral, resp. rate 20, height 5\' 3"  (1.6 m), weight 64.864 kg (143 lb), SpO2 95.00%. PSYCH: She is alert and oriented x4; does not appear anxious does not appear depressed; affect is normal HEENT: Normocephalic and Atraumatic, Mucous membranes pink; PERRLA; EOM intact; Fundi:  Benign;  No scleral icterus, Nares: Patent, Oropharynx: Clear, Fair Dentition, Neck:  FROM, no cervical lymphadenopathy nor thyromegaly or carotid bruit; no JVD; Breasts:: Not examined CHEST WALL: No tenderness CHEST: Normal respiration, clear to auscultation bilaterally HEART: Regular rate and rhythm; no murmurs rubs or gallops BACK: No kyphosis or scoliosis; no CVA tenderness ABDOMEN: Positive Bowel Sounds,Obese, soft non-tender; no masses, no organomegaly, no pannus; no intertriginous candida. Rectal Exam: Not done EXTREMITIES: No cyanosis, clubbing or edema; no ulcerations. Genitalia: not examined PULSES: 2+ and symmetric SKIN: Normal hydration no rash or ulceration CNS: Cranial nerves 2-12 grossly intact no focal neurologic deficit    Labs on Admission:  Basic Metabolic Panel:  Recent Labs Lab 05/18/13 1200  NA 136  K 3.3*  CL 100  CO2 24  GLUCOSE 152*  BUN 19   CREATININE 1.20*  CALCIUM 9.7   Liver Function Tests:  Recent Labs Lab 05/18/13 1200  AST 33  ALT 89*  ALKPHOS 120*  BILITOT 0.8  PROT 7.6  ALBUMIN 3.4*    Recent Labs Lab 05/18/13 1200  LIPASE 139*   No results found for this basename: AMMONIA,  in the last 168 hours CBC:  Recent Labs Lab 05/18/13 1200 05/18/13 2228  WBC 11.4* 11.9*  NEUTROABS 7.8*  --   HGB 12.6 12.3  HCT 37.8 35.7*  MCV 80.6 80.6  PLT 202 215   Cardiac Enzymes: No results found for this basename: CKTOTAL, CKMB, CKMBINDEX, TROPONINI,  in the last 168 hours  BNP (last 3 results)  Recent Labs  11/07/12 0530  PROBNP 2671.0*   CBG: No results found for this basename: GLUCAP,  in the last 168 hours  Radiological Exams on Admission: Ct Abdomen Pelvis W Contrast  05/18/2013   CLINICAL DATA:  Abdominal pain  EXAM: CT ABDOMEN AND PELVIS WITH CONTRAST  TECHNIQUE: Multidetector CT imaging of the abdomen and pelvis was performed using the standard protocol following bolus administration of intravenous contrast.  CONTRAST:  50mL OMNIPAQUE IOHEXOL 300 MG/ML SOLN, OMNIPAQUE IOHEXOL 300 MG/ML SOLN  COMPARISON:  11/05/2012  FINDINGS: Subsegmental atelectasis is present at both lung bases right greater than left. Tiny right pleural effusion.  Stable coronary artery calcifications.  There is stranding within the intraperitoneal fat of the right upper quadrant. The inflammatory process also involves the duodenum, pancreatic head, gallbladder, and hepatic flexure of the colon. No pneumatosis or extraluminal bowel gas. There is prominence of the wall of the gallbladder. There is also inflammatory change of the wall of the colon and duodenum. Fascia planes in the pancreatic head are blurred.  Lobulated benign appearing cysts in the liver are stable. Small amount of fluid density surrounds the spleen. Inflammatory changes within the lesser sac are also noted.  Stranding is seen along the right lower quadrant  retroperitoneum extending from the right upper quadrant.  No evidence of pancreatic hemorrhage or necrosis.  Mild inflammatory change of the adrenal glands.  Stable appearance of the kidneys.  The common bile duct remains mildly dilated at 9.5 mm.  The endometrial stripe is prominent measuring 16 mm in diameter.  Multilevel degenerative disc disease in the lumbar spine. Vacuum is present at L4-5 and L5-S1. Bulging discs at L2-3, L3-4, and L5-S1.  No acute bony deformity.  IMPRESSION: Inflammatory process in the right upper quadrant is present as described affecting the pancreatic head, duodenum, hepatic flexure of the colon, and gallbladder.  Common bile duct is dilated. Correlate with liver function tests as for the possibility of biliary obstruction.  Degenerative changes in the lumbar spine.  Small right pleural effusion and basilar atelectasis.   Electronically Signed   By: Maryclare Bean M.D.   On: 05/18/2013 14:35       Assessment/Plan Principal Problem:   Acute Pancreatitis Active Problems:   Colitis   Abdominal pain, epigastric, RUQ, LUQ   Transaminitis   Hypokalemia   HTN (hypertension)   Unspecified hypothyroidism   HOCM (hypertrophic obstructive cardiomyopathy)   OSA (obstructive sleep apnea)   Diastolic CHF   Hyperlipidemai   1.   Early Pancreatitis due to CT scan and US Findings- Admitted and placed  On IVFs, and Clear liquid Diet and IV Antiemetics PRN.      IV Protonox ordered.  Bentyl Rx held to to exacerbation of nausea and decreasing GI motility.  Seen by [facility] consult Dr Bosie Clos.        Monitor LFT and Lipase levels.     2.   ABD Pain - Due to #1.    3.   Transaminitis-   Due to possible Retained gallstone  In CBD,   MRCP or ERCP to be done by GI,  Monitor Trend of Lipase and LFTs.      4.   Hypokalemia- due to nausea and Vomiting, Replete K+.    5.   HTN- continue    Metoprolol, and Diltiazem Rx.    6.   Hypothyroid-  Continue Levothyroxine, and check TSH Level.    7.    HOCM- stable monitor for S/S of Fluid Overload.     8.   OSA-  No CPAP qhs until Nausea and Vomiting has improved.    9.  Diastolic CHF due to #7,  Stable on meds.    10. Hyperlipidemia- continue Pravastin Rx, or formulary substitute while hospitalized.    11.  DVT prophylaxis with Lovenox.     Code Status:   FULL CODE Family Communication:    Daughters at Bedside Disposition Plan:  Inpatient  Time spent:   6 Minutes  Ron Parker [facility] Pager [phone]  If 7PM-7AM, please contact night-coverage [email or web address] Password [email or web address] 05/18/2013, 11:07 PM

## 2013-05-18 NOTE — Progress Notes (Signed)
Patient presents with nausea, vomiting. CT abdomen showed: Inflammatory process in the right upper quadrant is present as  described affecting the pancreatic head, duodenum, hepatic flexure of the colon, and gallbladder. Common bile duct is dilated. Correlate with liver function tests as  for the possibility of biliary obstruction. Transaminases.  Stable for med-surgical bed. I have ask Dr. Jodi Mourning to please consult GI. No evidence of cholecystitis on bedside US done by Dr Danae Chen.   Elizabeth Shields.

## 2013-05-18 NOTE — ED Provider Notes (Signed)
CSN: [id number]     Arrival date & time 05/18/13  1112 History   First MD Initiated Contact with Patient 05/18/13 1122     Chief Complaint  Patient presents with  . Abdominal Pain   (Consider location/radiation/quality/duration/timing/severity/associated sxs/prior Treatment) HPI Comments: 79 yo female with gerd, pancreatitis hx presents with central and epig pain and nausea intermittent since Tuesday, mild radiation to the back.  Possibly similar to pancreas hx.  Worse after eating.  Intermittent.  No fevers.  No bleeding.  Improves with time.  No abdo surgery hx.  No known gallstones.  Patient is a 79 y.o. female presenting with abdominal pain. The history is provided by the patient.  Abdominal Pain Associated symptoms: nausea and vomiting   Associated symptoms: no chest pain, no chills, no dysuria, no fever and no shortness of breath     Past Medical History  Diagnosis Date  . Hypertension   . Thyroid disease   . Arthritis   . Hypothyroidism   . Anxiety   . Sleep apnea   . Borderline diabetes   . GERD (gastroesophageal reflux disease)   . HOCM (hypertrophic obstructive cardiomyopathy)     a. Dx 2011 by Dr. Launa Flight [facility];  b. 10/2012 Echo: EF 65-70% w/ dynamic obstruction @ rest in the outflow tract w/ peak velocity of 500 cm/sec & peak gradient of , nl wall motion, Gr 1 DD, sev SAM of the ant leaflet, mild to mod MR,pasp .    Past Surgical History  Procedure Laterality Date  . Tubal ligation     Family History  Problem Relation Age of Onset  . Heart disease      mother, father, 4 siblings - pt unaware of details   History  Substance Use Topics  . Smoking status: Never Smoker   . Smokeless tobacco: Never Used  . Alcohol Use: No   OB History   Grav Para Term Preterm Abortions TAB SAB Ect Mult Living                 Review of Systems  Constitutional: Positive for appetite change. Negative for fever and chills.  Eyes:  Negative for visual disturbance.  Respiratory: Negative for shortness of breath.   Cardiovascular: Negative for chest pain.  Gastrointestinal: Positive for nausea, vomiting and abdominal pain.  Genitourinary: Negative for dysuria and flank pain.  Musculoskeletal: Negative for back pain, neck pain and neck stiffness.  Skin: Negative for rash.  Neurological: Negative for light-headedness and headaches.    Allergies  Sulfa antibiotics  Home Medications   Current Outpatient Rx  Name  Route  Sig  Dispense  Refill  . acetaminophen (TYLENOL) 500 MG tablet   Oral   Take 500 mg by mouth every 6 (six) hours as needed for pain.         Marland Kitchen ALPRAZolam (XANAX) 0.5 MG tablet      0.5 mg 2 (two) times daily.         Marland Kitchen aspirin 81 MG tablet   Oral   Take 81 mg by mouth daily.         . Calcium Carbonate-Vitamin D (CALTRATE 600+D) 600-400 MG-UNIT per tablet   Oral   Take 1 tablet by mouth 2 (two) times daily.         Marland Kitchen dicyclomine (BENTYL) 10 MG capsule   Oral   Take 10 mg by mouth 2 (two) times daily.         Marland Kitchen  diltiazem (TIAZAC) 180 MG 24 hr capsule   Oral   Take 240 mg by mouth daily.          . furosemide (LASIX) 20 MG tablet   Oral   Take 1 tablet (20 mg total) by mouth daily.   4 tablet   0   . levothyroxine (SYNTHROID, LEVOTHROID) 25 MCG tablet   Oral   Take 25 mcg by mouth daily before breakfast.         . metoprolol succinate (TOPROL-XL) 100 MG 24 hr tablet   Oral   Take 100 mg by mouth daily. Take with or immediately following a meal.         . Multiple Vitamins-Calcium (ONE-A-DAY WOMENS FORMULA PO)   Oral   Take 1 tablet by mouth daily.         . pantoprazole (PROTONIX) 40 MG tablet   Oral   Take 40 mg by mouth daily.         . potassium chloride (K-DUR) 10 MEQ tablet   Oral   Take 2 tablets (20 mEq total) by mouth daily.   4 tablet   0   . pravastatin (PRAVACHOL) 10 MG tablet   Oral   Take 10 mg by mouth daily.         .  raloxifene (EVISTA) 60 MG tablet   Oral   Take 60 mg by mouth daily.          BP 146/59  Pulse 70  Temp(Src) 97.6 F (36.4 C) (Oral)  Resp 18  Ht 5\' 3"  (1.6 m)  Wt 143 lb (64.864 kg)  BMI 25.34 kg/m2  SpO2 96% Physical Exam  Nursing note and vitals reviewed. Constitutional: She is oriented to person, place, and time. She appears well-developed and well-nourished.  HENT:  Head: Normocephalic and atraumatic.  Mild dry mm  Eyes: Conjunctivae are normal. Right eye exhibits no discharge. Left eye exhibits no discharge.  Neck: Normal range of motion. Neck supple. No tracheal deviation present.  Cardiovascular: Normal rate and regular rhythm.   Pulmonary/Chest: Effort normal and breath sounds normal.  Abdominal: Soft. She exhibits no distension. There is tenderness (epig and supraumbilical). There is no guarding.  Musculoskeletal: She exhibits no edema.  Neurological: She is alert and oriented to person, place, and time.  Skin: Skin is warm. No rash noted.  Psychiatric: She has a normal mood and affect.    ED Course  Procedures (including critical care time) EMERGENCY DEPARTMENT ULTRASOUND  Study: Limited Retroperitoneal Ultrasound of the Abdominal Aorta.  INDICATIONS:Abdominal pain, Back pain and Age>55 Multiple views of the abdominal aorta were obtained in real-time from the diaphragmatic hiatus to the aortic bifurcation in transverse planes with a multi-frequency probe. PERFORMED BY: Myself IMAGES ARCHIVED?: Yes FINDINGS: Maximum aortic dimensions are 2 cm LIMITATIONS:  Bowel gas INTERPRETATION:  No abdominal aortic aneurysm   EMERGENCY DEPARTMENT BILIARY ULTRASOUND INTERPRETATION "Study: Limited Abdominal Ultrasound of the gallbladder and common bile duct."  INDICATIONS: Abdominal pain, Nausea and Vomiting Indication: Multiple views of the gallbladder and common bile duct were obtained in real-time with a Multi-frequency probe." PERFORMED BY:  Myself IMAGES ARCHIVED?:  Yes FINDINGS: Gallstones present, Gallbladder wall normal in thickness, Sonographic Murphy's sign absent and Common bile duct enlarged LIMITATIONS: Bowel Gas INTERPRETATION: Coledocholithiasis one view showed shadowing - likely stone in either GB neck or enlarged CBD    Labs Review Labs Reviewed  URINALYSIS, ROUTINE W REFLEX MICROSCOPIC - Abnormal; Notable for the following:  Color, Urine AMBER (*)    APPearance CLOUDY (*)    Hgb urine dipstick MODERATE (*)    Bilirubin Urine MODERATE (*)    Ketones, ur 15 (*)    Protein, ur 100 (*)    Urobilinogen, UA 4.0 (*)    Leukocytes, UA MODERATE (*)    All other components within normal limits  CBC WITH DIFFERENTIAL - Abnormal; Notable for the following:    WBC 11.4 (*)    Neutro Abs 7.8 (*)    All other components within normal limits  COMPREHENSIVE METABOLIC PANEL - Abnormal; Notable for the following:    Potassium 3.3 (*)    Glucose, Bld 152 (*)    Creatinine, Ser 1.20 (*)    Albumin 3.4 (*)    ALT 89 (*)    Alkaline Phosphatase 120 (*)    GFR calc non Af Amer 42 (*)    GFR calc Af Amer 48 (*)    All other components within normal limits  LIPASE, BLOOD - Abnormal; Notable for the following:    Lipase 139 (*)    All other components within normal limits  URINE MICROSCOPIC-ADD ON - Abnormal; Notable for the following:    Bacteria, UA MANY (*)    Casts HYALINE CASTS (*)    All other components within normal limits  URINE CULTURE   Imaging Review No results found.  EKG Interpretation   None       MDM  No diagnosis found. Clinically concern for biliary dz/ stones.  With pain radiating to the back in older female bedside US done and found normal diameter aorta abdominal And possible CBD stone on GB. Pt improved in ED with fluids/ medicines.   Multiple rechecks, comfortable, no fevers.  Mild UTI.  Rocephin given.  CT results reviewed - inflammation RUQ, no cholecystitis.  Concern for possible CBD stone with  dilation for CBD.   Spoke with Dr Flora Lipps, accepted at [facility] updated family. Paged GI to update on patient and transfer. PO K given, hypok. The patients results and plan were reviewed and discussed.   Any x-rays performed were personally reviewed by myself.   Differential diagnosis were considered with the presenting HPI.  Diagnosis: Epig pain, Dehydration, UTI, LFT elevation, HypoK, colitis   Admission/ observation were discussed with the admitting physician, patient and/or family and they are comfortable with the plan.      Enid Skeens, MD 05/18/13 [phone]

## 2013-05-18 NOTE — Consult Note (Signed)
Referring Provider: Dr. Sunnie Nielsen Primary Care Physician:  No primary provider on file. Primary Gastroenterologist:  Gentry Fitz  Reason for Consultation:  Abdominal pain  HPI: Elizabeth Shields is a 79 y.o. female who had the acute onset of nausea and vomiting Tuesday that persisted all week. She had intermittent abdominal discomfort in her epigastric region yesterday. Reports episode of pancreatitis in April and pt and daughter state that no source was identified and she does not recall being told she had gallstones then (NO gallstones seen on imaging in April). Has had diaphoresis at times this week. Denies fevers/chills/lightheadness. Lipase 139, ALP 120, ALT 89, TB and AST normal. WBC normal. Afebrile. Bedside U/S at [facility] concerning for possible CBD stone and gallstones seen. CBD noted to be dilated (size not given). CT showed inflammatory process in RUQ involving pancreatic head and GB along with duodenum and hepatic flexure. CBD "mildly dilated at 9.5 mm." Denies alcohol. Feels ok now. Daughter at bedside.     Past Medical History  Diagnosis Date  . Hypertension   . Thyroid disease   . Arthritis   . Hypothyroidism   . Anxiety   . Sleep apnea   . Borderline diabetes   . GERD (gastroesophageal reflux disease)   . HOCM (hypertrophic obstructive cardiomyopathy)     a. Dx 2011 by Dr. Launa Flight [facility];  b. 10/2012 Echo: EF 65-70% w/ dynamic obstruction @ rest in the outflow tract w/ peak velocity of 500 cm/sec & peak gradient of , nl wall motion, Gr 1 DD, sev SAM of the ant leaflet, mild to mod MR,pasp .     Past Surgical History  Procedure Laterality Date  . Tubal ligation      Prior to Admission medications   Medication Sig Start Date End Date Taking? Authorizing Provider  acetaminophen (TYLENOL) 500 MG tablet Take 500 mg by mouth every 6 (six) hours as needed for pain.   Yes Historical Provider, MD  ALPRAZolam (XANAX) 0.5 MG tablet 0.5  mg 2 (two) times daily.   Yes Historical Provider, MD  aspirin 81 MG tablet Take 81 mg by mouth daily.   Yes Historical Provider, MD  dicyclomine (BENTYL) 10 MG capsule Take 10 mg by mouth 2 (two) times daily.   Yes Historical Provider, MD  diltiazem (DILACOR XR) 240 MG 24 hr capsule Take 240 mg by mouth daily.   Yes Historical Provider, MD  levothyroxine (SYNTHROID, LEVOTHROID) 25 MCG tablet Take 25 mcg by mouth daily before breakfast.   Yes Historical Provider, MD  metoprolol succinate (TOPROL-XL) 100 MG 24 hr tablet Take 100 mg by mouth daily. Take with or immediately following a meal.   Yes Historical Provider, MD  Multiple Vitamins-Calcium (ONE-A-DAY WOMENS FORMULA PO) Take 1 tablet by mouth daily.   Yes Historical Provider, MD  pantoprazole (PROTONIX) 40 MG tablet Take 40 mg by mouth daily.   Yes Historical Provider, MD  potassium chloride (K-DUR) 10 MEQ tablet Take 2 tablets (20 mEq total) by mouth daily. 11/10/12  Yes Shanker Levora Dredge, MD  pravastatin (PRAVACHOL) 10 MG tablet Take 10 mg by mouth daily.   Yes Historical Provider, MD  raloxifene (EVISTA) 60 MG tablet Take 60 mg by mouth daily.   Yes Historical Provider, MD    Scheduled Meds: . sodium chloride   Intravenous STAT  . ALPRAZolam  0.5 mg Oral BID  . [START ON 05/19/2013] aspirin  81 mg Oral Daily  . cefTRIAXone      .  dicyclomine  10 mg Oral BID  . [START ON 05/19/2013] diltiazem  240 mg Oral Daily  . enoxaparin (LOVENOX) injection  40 mg Subcutaneous Q24H  . [START ON 05/19/2013] levothyroxine  25 mcg Oral QAC breakfast  . [START ON 05/19/2013] metoprolol succinate  100 mg Oral Daily  . pantoprazole (PROTONIX) IV  40 mg Intravenous Q24H  . [START ON 05/19/2013] potassium chloride  20 mEq Oral Daily  . [START ON 05/19/2013] raloxifene  60 mg Oral Daily  . [START ON 05/19/2013] simvastatin  5 mg Oral q1800   Continuous Infusions: . sodium chloride     PRN Meds:.acetaminophen, acetaminophen, alum & mag hydroxide-simeth,  HYDROmorphone (DILAUDID) injection, ondansetron (ZOFRAN) IV, ondansetron, oxyCODONE, zolpidem  Allergies as of 05/18/2013 - Review Complete 05/18/2013  Allergen Reaction Noted  . Sulfa antibiotics Nausea And Vomiting 11/05/2012    Family History  Problem Relation Age of Onset  . Heart disease      mother, father, 4 siblings - pt unaware of details    History   Social History  . Marital Status: Single    Spouse Name: N/A    Number of Children: N/A  . Years of Education: N/A   Occupational History  . Not on file.   Social History Main Topics  . Smoking status: Never Smoker   . Smokeless tobacco: Never Used  . Alcohol Use: No  . Drug Use: No  . Sexual Activity: No   Other Topics Concern  . Not on file   Social History Narrative  . No narrative on file    Review of Systems: All negative from a GI standpoint except as stated above in HPI.  Physical Exam: Vital signs: Filed Vitals:   05/18/13 1602  BP: 147/60  Pulse: 63  Temp: 98.2 F (36.8 C)  Resp: 20   Last BM Date: 05/18/13 General:   Alert,  Elderly, Well-developed, well-nourished, pleasant and cooperative in NAD Head: atraumatic Eyes: anicteric Mouth: normal appearing buccal mucosa Neck: supple, nontender Lungs:  Clear throughout to auscultation.   No wheezes, crackles, or rhonchi. No acute distress. Heart:  Regular rate and rhythm; no murmurs, clicks, rubs,  or gallops. Abdomen: epigastric tenderness with guarding, otherwise nontender, soft, nondistended, +BS  Rectal:  Deferred Ext: no edema Skin: no rash noted; dry; warm Neuro: Alert and oriented X 3 Psych: normal mood and affect  GI:  Lab Results:  Recent Labs  05/18/13 1200  WBC 11.4*  HGB 12.6  HCT 37.8  PLT 202   BMET  Recent Labs  05/18/13 1200  NA 136  K 3.3*  CL 100  CO2 24  GLUCOSE 152*  BUN 19  CREATININE 1.20*  CALCIUM 9.7   LFT  Recent Labs  05/18/13 1200  PROT 7.6  ALBUMIN 3.4*  AST 33  ALT 89*  ALKPHOS  120*  BILITOT 0.8   PT/INR No results found for this basename: LABPROT, INR,  in the last 72 hours   Studies/Results: Ct Abdomen Pelvis W Contrast  05/18/2013   CLINICAL DATA:  Abdominal pain  EXAM: CT ABDOMEN AND PELVIS WITH CONTRAST  TECHNIQUE: Multidetector CT imaging of the abdomen and pelvis was performed using the standard protocol following bolus administration of intravenous contrast.  CONTRAST:  50mL OMNIPAQUE IOHEXOL 300 MG/ML SOLN, OMNIPAQUE IOHEXOL 300 MG/ML SOLN  COMPARISON:  11/05/2012  FINDINGS: Subsegmental atelectasis is present at both lung bases right greater than left. Tiny right pleural effusion.  Stable coronary artery calcifications.  There is  stranding within the intraperitoneal fat of the right upper quadrant. The inflammatory process also involves the duodenum, pancreatic head, gallbladder, and hepatic flexure of the colon. No pneumatosis or extraluminal bowel gas. There is prominence of the wall of the gallbladder. There is also inflammatory change of the wall of the colon and duodenum. Fascia planes in the pancreatic head are blurred.  Lobulated benign appearing cysts in the liver are stable. Small amount of fluid density surrounds the spleen. Inflammatory changes within the lesser sac are also noted.  Stranding is seen along the right lower quadrant retroperitoneum extending from the right upper quadrant.  No evidence of pancreatic hemorrhage or necrosis.  Mild inflammatory change of the adrenal glands.  Stable appearance of the kidneys.  The common bile duct remains mildly dilated at 9.5 mm.  The endometrial stripe is prominent measuring 16 mm in diameter.  Multilevel degenerative disc disease in the lumbar spine. Vacuum is present at L4-5 and L5-S1. Bulging discs at L2-3, L3-4, and L5-S1.  No acute bony deformity.  IMPRESSION: Inflammatory process in the right upper quadrant is present as described affecting the pancreatic head, duodenum, hepatic flexure of the colon,  and gallbladder.  Common bile duct is dilated. Correlate with liver function tests as for the possibility of biliary obstruction.  Degenerative changes in the lumbar spine.  Small right pleural effusion and basilar atelectasis.   Electronically Signed   By: Maryclare Bean M.D.   On: 05/18/2013 14:35    Impression/Plan: 78 yo pleasant woman admitted due to abdominal pain and recurrent N/V that started this week and CT and U/S imaging showing gallstones and mildly dilated common bile duct. LFTs (ALP, ALT) minimally elevated and otherwise normal. No signs of cholangitis based on her history and normal WBC, afebrile, and no jaundice. I think she has mild gallstone pancreatitis and additional imagine is needed to determine if she has a CBD stone. She may have passed a stone. Will do an MRCP as the next step and if common bile duct stone noted then will need an ERCP. Discussed risks of ERCP in case that is needed. NPO p MN for MRCP in AM. Increase IVFs. Replace electrolytes. Follow LFTs.    LOS: 0 days   Tahisha Hakim C.  05/18/2013, 9:41 PM

## 2013-05-18 NOTE — ED Notes (Signed)
MD at bedside. 

## 2013-05-18 NOTE — ED Notes (Signed)
Patient here with nausea and abdominal pain with gas and full feeling since Tuesday. Reports multiple episodes of vomiting on Tuesday. Has had minimal solid intake, pain yesterday after eating solids and pain better today, hx of pancreatitis back in April

## 2013-05-19 ENCOUNTER — Encounter (HOSPITAL_COMMUNITY): Payer: Self-pay | Admitting: Internal Medicine

## 2013-05-19 ENCOUNTER — Observation Stay (HOSPITAL_COMMUNITY): Payer: Medicare Other

## 2013-05-19 DIAGNOSIS — N390 Urinary tract infection, site not specified: Secondary | ICD-10-CM

## 2013-05-19 DIAGNOSIS — I5030 Unspecified diastolic (congestive) heart failure: Secondary | ICD-10-CM

## 2013-05-19 DIAGNOSIS — K8020 Calculus of gallbladder without cholecystitis without obstruction: Secondary | ICD-10-CM

## 2013-05-19 DIAGNOSIS — E876 Hypokalemia: Secondary | ICD-10-CM | POA: Diagnosis present

## 2013-05-19 DIAGNOSIS — K529 Noninfective gastroenteritis and colitis, unspecified: Secondary | ICD-10-CM | POA: Insufficient documentation

## 2013-05-19 DIAGNOSIS — K859 Acute pancreatitis without necrosis or infection, unspecified: Secondary | ICD-10-CM

## 2013-05-19 HISTORY — DX: Unspecified diastolic (congestive) heart failure: I50.30

## 2013-05-19 LAB — BASIC METABOLIC PANEL
BUN: 12 mg/dL (ref 6–23)
CO2: 20 mEq/L (ref 19–32)
Chloride: 105 mEq/L (ref 96–112)
Creatinine, Ser: 1 mg/dL (ref 0.50–1.10)
GFR calc Af Amer: 60 mL/min — ABNORMAL LOW (ref >90)
Potassium: 4.2 mEq/L (ref 3.5–5.1)
Sodium: 139 mEq/L (ref 135–145)

## 2013-05-19 LAB — URINE CULTURE: Culture: NO GROWTH

## 2013-05-19 LAB — CBC
HCT: 36.9 % (ref 36.0–46.0)
Hemoglobin: 12.4 g/dL (ref 12.0–15.0)
MCHC: 33.6 g/dL (ref 30.0–36.0)
MCV: 81.1 fL (ref 78.0–100.0)
RBC: 4.55 MIL/uL (ref 3.87–5.11)
RDW: 15.2 % (ref 11.5–15.5)
WBC: 10.1 10*3/uL (ref 4.0–10.5)

## 2013-05-19 LAB — HEPATIC FUNCTION PANEL
ALT: 65 U/L — ABNORMAL HIGH (ref 0–35)
Albumin: 2.9 g/dL — ABNORMAL LOW (ref 3.5–5.2)
Alkaline Phosphatase: 115 U/L (ref 39–117)
Bilirubin, Direct: 0.2 mg/dL (ref 0.0–0.3)
Indirect Bilirubin: 0.3 mg/dL (ref 0.3–0.9)
Total Bilirubin: 0.5 mg/dL (ref 0.3–1.2)
Total Protein: 7 g/dL (ref 6.0–8.3)

## 2013-05-19 MED ORDER — DEXTROSE 5 % IV SOLN
1.0000 g | Freq: Every day | INTRAVENOUS | Status: DC
  Administered 2013-05-19: 1 g via INTRAVENOUS
  Filled 2013-05-19 (×2): qty 10

## 2013-05-19 MED ORDER — SODIUM CHLORIDE 0.9 % IV SOLN: INTRAVENOUS | Status: DC

## 2013-05-19 MED ORDER — HYDROMORPHONE HCL PF 1 MG/ML IJ SOLN: 0.5000 mg | INTRAMUSCULAR | Status: DC | PRN

## 2013-05-19 NOTE — Consult Note (Signed)
Reason for Consult:gallstone pancreatitis Referring Physician: Dr. Hartley Barefoot  Elizabeth Shields is an 79 y.o. female.  HPI: I have been asked to evaluate this patient today for the finding of gallstone pancreatitis. She was admitted yesterday with a four-day history of abdominal pain, nausea, vomiting, and diarrhea. The pain was diffuse but on further questioning was mostly the upper abdomen. She believes she has had one similar attack in the past of pancreatitis. During this hospitalization, she has had an MRCP which was negative except for small gallstones. No obstruction of the bile duct has been seen. Today, she feels much better and has almost no amount of discomfort. She currently is otherwise without complaints.  Past Medical History  Diagnosis Date  . Hypertension   . Thyroid disease   . Arthritis   . Hypothyroidism   . Anxiety   . Sleep apnea   . Borderline diabetes   . GERD (gastroesophageal reflux disease)   . HOCM (hypertrophic obstructive cardiomyopathy)     a. Dx 2011 by Dr. Launa Flight [facility];  b. 10/2012 Echo: EF 65-70% w/ dynamic obstruction @ rest in the outflow tract w/ peak velocity of 500 cm/sec & peak gradient of , nl wall motion, Gr 1 DD, sev SAM of the ant leaflet, mild to mod MR,pasp .   . Diastolic CHF 05/19/2013    Past Surgical History  Procedure Laterality Date  . Tubal ligation      Family History  Problem Relation Age of Onset  . Heart disease      mother, father, 4 siblings - pt unaware of details    Social History:  reports that she has never smoked. She has never used smokeless tobacco. She reports that she does not drink alcohol or use illicit drugs.  Allergies:  Allergies  Allergen Reactions  . Sulfa Antibiotics Nausea And Vomiting    Medications: I have reviewed the patient's current medications.  Results for orders placed during the hospital encounter of 05/18/13 (from the past 48 hour(s))   URINALYSIS, ROUTINE W REFLEX MICROSCOPIC     Status: Abnormal   Collection Time    05/18/13 11:38 AM      Result Value Range   Color, Urine AMBER (*) YELLOW   Comment: BIOCHEMICALS MAY BE AFFECTED BY COLOR   APPearance CLOUDY (*) CLEAR   Specific Gravity, Urine 1.022  1.005 - 1.030   pH 5.5  5.0 - 8.0   Glucose, UA NEGATIVE  NEGATIVE mg/dL   Hgb urine dipstick MODERATE (*) NEGATIVE   Bilirubin Urine MODERATE (*) NEGATIVE   Ketones, ur 15 (*) NEGATIVE mg/dL   Protein, ur 161 (*) NEGATIVE mg/dL   Urobilinogen, UA 4.0 (*) 0.0 - 1.0 mg/dL   Nitrite NEGATIVE  NEGATIVE   Leukocytes, UA MODERATE (*) NEGATIVE  URINE MICROSCOPIC-ADD ON     Status: Abnormal   Collection Time    05/18/13 11:38 AM      Result Value Range   Squamous Epithelial / LPF RARE  RARE   WBC, UA 3-6  <3 WBC/hpf   RBC / HPF 0-2  <3 RBC/hpf   Bacteria, UA MANY (*) RARE   Casts HYALINE CASTS (*) NEGATIVE   Comment: GRANULAR CAST   Urine-Other MUCOUS PRESENT    CBC WITH DIFFERENTIAL     Status: Abnormal   Collection Time    05/18/13 12:00 PM      Result Value Range   WBC 11.4 (*) 4.0 - 10.5  K/uL   RBC 4.69  3.87 - 5.11 MIL/uL   Hemoglobin 12.6  12.0 - 15.0 g/dL   HCT 16.1  09.6 - 04.5 %   MCV 80.6  78.0 - 100.0 fL   MCH 26.9  26.0 - 34.0 pg   MCHC 33.3  30.0 - 36.0 g/dL   RDW 40.9  81.1 - 91.4 %   Platelets 202  150 - 400 K/uL   Neutrophils Relative % 68  43 - 77 %   Neutro Abs 7.8 (*) 1.7 - 7.7 K/uL   Lymphocytes Relative 21  12 - 46 %   Lymphs Abs 2.4  0.7 - 4.0 K/uL   Monocytes Relative 9  3 - 12 %   Monocytes Absolute 1.0  0.1 - 1.0 K/uL   Eosinophils Relative 1  0 - 5 %   Eosinophils Absolute 0.2  0.0 - 0.7 K/uL   Basophils Relative 0  0 - 1 %   Basophils Absolute 0.0  0.0 - 0.1 K/uL  COMPREHENSIVE METABOLIC PANEL     Status: Abnormal   Collection Time    05/18/13 12:00 PM      Result Value Range   Sodium 136  135 - 145 mEq/L   Potassium 3.3 (*) 3.5 - 5.1 mEq/L   Chloride 100  96 - 112 mEq/L    CO2 24  19 - 32 mEq/L   Glucose, Bld 152 (*) 70 - 99 mg/dL   BUN 19  6 - 23 mg/dL   Creatinine, Ser 7.82 (*) 0.50 - 1.10 mg/dL   Calcium 9.7  8.4 - 95.6 mg/dL   Total Protein 7.6  6.0 - 8.3 g/dL   Albumin 3.4 (*) 3.5 - 5.2 g/dL   AST 33  0 - 37 U/L   ALT 89 (*) 0 - 35 U/L   Alkaline Phosphatase 120 (*) 39 - 117 U/L   Total Bilirubin 0.8  0.3 - 1.2 mg/dL   GFR calc non Af Amer 42 (*) >90 mL/min   GFR calc Af Amer 48 (*) >90 mL/min   Comment: (NOTE)     The eGFR has been calculated using the CKD EPI equation.     This calculation has not been validated in all clinical situations.     eGFR's persistently <90 mL/min signify possible Chronic Kidney     Disease.  LIPASE, BLOOD     Status: Abnormal   Collection Time    05/18/13 12:00 PM      Result Value Range   Lipase 139 (*) 11 - 59 U/L  CBC     Status: Abnormal   Collection Time    05/18/13 10:28 PM      Result Value Range   WBC 11.9 (*) 4.0 - 10.5 K/uL   RBC 4.43  3.87 - 5.11 MIL/uL   Hemoglobin 12.3  12.0 - 15.0 g/dL   HCT 21.3 (*) 08.6 - 57.8 %   MCV 80.6  78.0 - 100.0 fL   MCH 27.8  26.0 - 34.0 pg   MCHC 34.5  30.0 - 36.0 g/dL   RDW 46.9  62.9 - 52.8 %   Platelets 215  150 - 400 K/uL  CREATININE, SERUM     Status: Abnormal   Collection Time    05/18/13 10:28 PM      Result Value Range   Creatinine, Ser 0.90  0.50 - 1.10 mg/dL   GFR calc non Af Amer 59 (*) >90 mL/min   GFR calc Af  Amer 69 (*) >90 mL/min   Comment: (NOTE)     The eGFR has been calculated using the CKD EPI equation.     This calculation has not been validated in all clinical situations.     eGFR's persistently <90 mL/min signify possible Chronic Kidney     Disease.  LIPASE, BLOOD     Status: Abnormal   Collection Time    05/19/13  5:00 AM      Result Value Range   Lipase 134 (*) 11 - 59 U/L  BASIC METABOLIC PANEL     Status: Abnormal   Collection Time    05/19/13  5:45 AM      Result Value Range   Sodium 139  135 - 145 mEq/L   Potassium 4.2  3.5  - 5.1 mEq/L   Chloride 105  96 - 112 mEq/L   CO2 20  19 - 32 mEq/L   Glucose, Bld 90  70 - 99 mg/dL   BUN 12  6 - 23 mg/dL   Creatinine, Ser 4.09  0.50 - 1.10 mg/dL   Calcium 9.0  8.4 - 81.1 mg/dL   GFR calc non Af Amer 52 (*) >90 mL/min   GFR calc Af Amer 60 (*) >90 mL/min   Comment: (NOTE)     The eGFR has been calculated using the CKD EPI equation.     This calculation has not been validated in all clinical situations.     eGFR's persistently <90 mL/min signify possible Chronic Kidney     Disease.  CBC     Status: None   Collection Time    05/19/13  5:45 AM      Result Value Range   WBC 10.1  4.0 - 10.5 K/uL   RBC 4.55  3.87 - 5.11 MIL/uL   Hemoglobin 12.4  12.0 - 15.0 g/dL   HCT 91.4  78.2 - 95.6 %   MCV 81.1  78.0 - 100.0 fL   MCH 27.3  26.0 - 34.0 pg   MCHC 33.6  30.0 - 36.0 g/dL   RDW 21.3  08.6 - 57.8 %   Platelets 229  150 - 400 K/uL  HEPATIC FUNCTION PANEL     Status: Abnormal   Collection Time    05/19/13  5:45 AM      Result Value Range   Total Protein 7.0  6.0 - 8.3 g/dL   Albumin 2.9 (*) 3.5 - 5.2 g/dL   AST 24  0 - 37 U/L   ALT 65 (*) 0 - 35 U/L   Alkaline Phosphatase 115  39 - 117 U/L   Total Bilirubin 0.5  0.3 - 1.2 mg/dL   Bilirubin, Direct 0.2  0.0 - 0.3 mg/dL   Indirect Bilirubin 0.3  0.3 - 0.9 mg/dL    Ct Abdomen Pelvis W Contrast  05/18/2013   CLINICAL DATA:  Abdominal pain  EXAM: CT ABDOMEN AND PELVIS WITH CONTRAST  TECHNIQUE: Multidetector CT imaging of the abdomen and pelvis was performed using the standard protocol following bolus administration of intravenous contrast.  CONTRAST:  50mL OMNIPAQUE IOHEXOL 300 MG/ML SOLN, OMNIPAQUE IOHEXOL 300 MG/ML SOLN  COMPARISON:  11/05/2012  FINDINGS: Subsegmental atelectasis is present at both lung bases right greater than left. Tiny right pleural effusion.  Stable coronary artery calcifications.  There is stranding within the intraperitoneal fat of the right upper quadrant. The inflammatory process also  involves the duodenum, pancreatic head, gallbladder, and hepatic flexure of the colon. No pneumatosis  or extraluminal bowel gas. There is prominence of the wall of the gallbladder. There is also inflammatory change of the wall of the colon and duodenum. Fascia planes in the pancreatic head are blurred.  Lobulated benign appearing cysts in the liver are stable. Small amount of fluid density surrounds the spleen. Inflammatory changes within the lesser sac are also noted.  Stranding is seen along the right lower quadrant retroperitoneum extending from the right upper quadrant.  No evidence of pancreatic hemorrhage or necrosis.  Mild inflammatory change of the adrenal glands.  Stable appearance of the kidneys.  The common bile duct remains mildly dilated at 9.5 mm.  The endometrial stripe is prominent measuring 16 mm in diameter.  Multilevel degenerative disc disease in the lumbar spine. Vacuum is present at L4-5 and L5-S1. Bulging discs at L2-3, L3-4, and L5-S1.  No acute bony deformity.  IMPRESSION: Inflammatory process in the right upper quadrant is present as described affecting the pancreatic head, duodenum, hepatic flexure of the colon, and gallbladder.  Common bile duct is dilated. Correlate with liver function tests as for the possibility of biliary obstruction.  Degenerative changes in the lumbar spine.  Small right pleural effusion and basilar atelectasis.   Electronically Signed   By: Maryclare Bean M.D.   On: 05/18/2013 14:35   Mr Abdomen Mrcp Wo Cm  05/19/2013   CLINICAL DATA:  Pancreatitis.  EXAM: MRI ABDOMEN WITHOUT  (INCLUDING MRCP)  TECHNIQUE: Multiplanar multisequence MR imaging of the abdomen was performed. Heavily T2-weighted images of the biliary and pancreatic ducts were obtained, and three-dimensional MRCP images were rendered by post processing.  COMPARISON:  CT scan 05/18/2013.  FINDINGS: There are small bilateral pleural effusions and bibasilar atelectasis.  The liver demonstrates complex cysts  in the right and left hepatic lobes. These are unchanged since the April CT scan. No intrahepatic biliary dilatation. The common bile duct is normal in caliber and has a normal course. The main pancreatic duct is normal. No common bile duct stones are identified. The maximum diameter of the common bowel duct and head of the pancreas is 9 mm. This is borderline enlarged for age. No ampullary mass or pancreatic head mass is identified. There may be a few tiny gallstones near the neck of the gallbladder. The gallbladder is tortuous and somewhat redundant.  Pancreas demonstrates mild diffuse inflammation consistent with acute pancreatitis. There is edema, fluid and inflammatory phlegmon surrounding the pancreas. The spleen is normal in size. No focal lesions. The adrenal glands and kidneys are unremarkable.  The aorta is normal in caliber. No mesenteric or retroperitoneal mass or adenopathy. The visualized bowel is unremarkable.  IMPRESSION: Acute pancreatitis.  Mildly distended tortuous and redundant gallbladder with possible tiny stones in the neck region.  No common bile duct stones. Borderline common bile duct dilatation at 9 mm.  The main pancreatic duct is normal. No pancreatic head mass or ampullary lesion.  Benign-appearing but complex hepatic cysts.   Electronically Signed   By: Loralie Champagne M.D.   On: 05/19/2013 11:28   Mr 3d Recon At Scanner  05/19/2013   CLINICAL DATA:  Pancreatitis.  EXAM: MRI ABDOMEN WITHOUT  (INCLUDING MRCP)  TECHNIQUE: Multiplanar multisequence MR imaging of the abdomen was performed. Heavily T2-weighted images of the biliary and pancreatic ducts were obtained, and three-dimensional MRCP images were rendered by post processing.  COMPARISON:  CT scan 05/18/2013.  FINDINGS: There are small bilateral pleural effusions and bibasilar atelectasis.  The liver demonstrates complex cysts in the  right and left hepatic lobes. These are unchanged since the April CT scan. No intrahepatic  biliary dilatation. The common bile duct is normal in caliber and has a normal course. The main pancreatic duct is normal. No common bile duct stones are identified. The maximum diameter of the common bowel duct and head of the pancreas is 9 mm. This is borderline enlarged for age. No ampullary mass or pancreatic head mass is identified. There may be a few tiny gallstones near the neck of the gallbladder. The gallbladder is tortuous and somewhat redundant.  Pancreas demonstrates mild diffuse inflammation consistent with acute pancreatitis. There is edema, fluid and inflammatory phlegmon surrounding the pancreas. The spleen is normal in size. No focal lesions. The adrenal glands and kidneys are unremarkable.  The aorta is normal in caliber. No mesenteric or retroperitoneal mass or adenopathy. The visualized bowel is unremarkable.  IMPRESSION: Acute pancreatitis.  Mildly distended tortuous and redundant gallbladder with possible tiny stones in the neck region.  No common bile duct stones. Borderline common bile duct dilatation at 9 mm.  The main pancreatic duct is normal. No pancreatic head mass or ampullary lesion.  Benign-appearing but complex hepatic cysts.   Electronically Signed   By: Loralie Champagne M.D.   On: 05/19/2013 11:28    Review of Systems  Constitutional: Negative for fever, chills and weight loss.  HENT: Negative for congestion and sore throat.   Respiratory: Negative for cough, hemoptysis and stridor.   Cardiovascular: Negative for chest pain, palpitations and orthopnea.  Gastrointestinal: Positive for nausea, vomiting, abdominal pain and diarrhea.  Genitourinary: Negative for dysuria and urgency.  Musculoskeletal: Negative for myalgias and neck pain.  Neurological: Positive for weakness. Negative for dizziness.  Endo/Heme/Allergies: Does not bruise/bleed easily.  Psychiatric/Behavioral: Negative.    Blood pressure 129/61, pulse 69, temperature 99 F (37.2 C), temperature source Oral,  resp. rate 18, height 5\' 3"  (1.6 m), weight 143 lb (64.864 kg), SpO2 98.00%. Physical Exam  Constitutional: She is oriented to person, place, and time. She appears well-developed and well-nourished. No distress.  HENT:  Head: Normocephalic and atraumatic.  Right Ear: External ear normal.  Left Ear: External ear normal.  Nose: Nose normal.  Mouth/Throat: Oropharynx is clear and moist. No oropharyngeal exudate.  Eyes: Conjunctivae are normal. Pupils are equal, round, and reactive to light. Right eye exhibits no discharge. Left eye exhibits no discharge. No scleral icterus.  Neck: Normal range of motion. Neck supple. No tracheal deviation present.  Cardiovascular: Normal rate, regular rhythm and intact distal pulses.   Murmur heard. Respiratory: Effort normal and breath sounds normal. No respiratory distress. She has no wheezes.  GI: Soft. Bowel sounds are normal. She exhibits no distension. There is no guarding.  Minimal epigastric and right upper quadrant tenderness  Musculoskeletal: Normal range of motion. She exhibits no edema and no tenderness.  Lymphadenopathy:    She has no cervical adenopathy.  Neurological: She is alert and oriented to person, place, and time.  Skin: Skin is warm and dry. No rash noted. She is not diaphoretic. No erythema.  Psychiatric: Her behavior is normal. Judgment normal.    Assessment/Plan: Gallstone pancreatitis  Laparoscopic cholecystectomy with cholangiogram is recommended during this hospitalization. I explained the reasonings for this with the patient and her daughter. Her symptoms have improved greatly regarding the pancreatitis. I discussed the laparoscopic procedure with her in detail.  I will make her NPO after midnight in case a laparoscopic cholecystectomy can be performed tomorrow  [facility]  05/19/2013, 2:32 PM

## 2013-05-19 NOTE — Progress Notes (Signed)
[facility] PROGRESS NOTE  Elizabeth Shields ZOX:[id number] DOB: [birth year] DOA: 05/18/2013 PCP: No primary provider on file.  Assessment/Plan: 1.Gall-stone Pancreatitis: Patient presents with abdominal pain, nausea. Mild increase lipase, transaminases.  -CT abdomen: Inflammatory process in the right upper quadrant is present as described affecting the pancreatic head, duodenum, hepatic flexure of the colon, and gallbladder. Common bile duct is dilated.  -MRCP: Mildly distended tortuous and redundant gallbladder with possible tiny stones in the neck region. No common bile duct stones. Borderline common bile duct dilatation at 9 mm. -Surgery consulted. If family agree with procedure, patient will need clearance by cardio.  -Continue with protonix, prn pain medication. Clear diet.   2. Transaminitis- secondary to number 1. Trending down.  3. Hypokalemia- Resolved.  4. HTN- continue Metoprolol, and Diltiazem Rx.  5. Hypothyroid- Continue Levothyroxine. 7. HOCM- stable . NSL.  8. OSA- No CPAP qhs until Nausea and Vomiting has improved.  9. Diastolic CHF due to #7, Stable on meds.  10. Hyperlipidemia- hold statin due to transaminases.  11. DVT prophylaxis with Lovenox.  12-Pyurea: continue with ceftriaxone; follow up urine culture.   Code Status: Full Code.  Family Communication: Care discussed with daughter.  Disposition Plan: to be determine.    Consultants:  Surgery.  GI, [facility].   Procedures: MRCP: Acute pancreatitis. Mildly distended tortuous and redundant gallbladder with possible tiny stones in the neck region. No common bile duct stones. Borderline common bile duct dilatation  at 9 mm.   Antibiotics:  Ceftriaxone 10-18  HPI/Subjective: No abdominal pain. Feeling better. No dyspnea.   Objective: Filed Vitals:   05/19/13 0627  BP: 142/64  Pulse: 87  Temp: 98.5 F (36.9 C)  Resp: 18    Intake/Output Summary (Last 24 hours) at 05/19/13 0904 Last data  filed at 05/19/13 8119  Gross per 24 hour  Intake      0 ml  Output    600 ml  Net   -600 ml   Filed Weights   05/18/13 1120  Weight: 64.864 kg (143 lb)    Exam:   General: No distress.   Cardiovascular: S 1, S 2 RRR  Respiratory: CTA  Abdomen: BS present, soft, mild tender.   Musculoskeletal: no edema.    Data Reviewed: Basic Metabolic Panel:  Recent Labs Lab 05/18/13 1200 05/18/13 2228 05/19/13 0545  NA 136  --  139  K 3.3*  --  4.2  CL 100  --  105  CO2 24  --  20  GLUCOSE 152*  --  90  BUN 19  --  12  CREATININE 1.20* 0.90 1.00  CALCIUM 9.7  --  9.0   Liver Function Tests:  Recent Labs Lab 05/18/13 1200 05/19/13 0545  AST 33 24  ALT 89* 65*  ALKPHOS 120* 115  BILITOT 0.8 0.5  PROT 7.6 7.0  ALBUMIN 3.4* 2.9*    Recent Labs Lab 05/18/13 1200 05/19/13 0500  LIPASE 139* 134*   No results found for this basename: AMMONIA,  in the last 168 hours CBC:  Recent Labs Lab 05/18/13 1200 05/18/13 2228 05/19/13 0545  WBC 11.4* 11.9* 10.1  NEUTROABS 7.8*  --   --   HGB 12.6 12.3 12.4  HCT 37.8 35.7* 36.9  MCV 80.6 80.6 81.1  PLT 202 215 229   Cardiac Enzymes: No results found for this basename: CKTOTAL, CKMB, CKMBINDEX, TROPONINI,  in the last 168 hours BNP (last 3 results)  Recent Labs  11/07/12 0530  PROBNP 2671.0*  CBG: No results found for this basename: GLUCAP,  in the last 168 hours  No results found for this or any previous visit (from the past 240 hour(s)).   Studies: Ct Abdomen Pelvis W Contrast  05/18/2013   CLINICAL DATA:  Abdominal pain  EXAM: CT ABDOMEN AND PELVIS WITH CONTRAST  TECHNIQUE: Multidetector CT imaging of the abdomen and pelvis was performed using the standard protocol following bolus administration of intravenous contrast.  CONTRAST:  50mL OMNIPAQUE IOHEXOL 300 MG/ML SOLN, OMNIPAQUE IOHEXOL 300 MG/ML SOLN  COMPARISON:  11/05/2012  FINDINGS: Subsegmental atelectasis is present at both lung bases right  greater than left. Tiny right pleural effusion.  Stable coronary artery calcifications.  There is stranding within the intraperitoneal fat of the right upper quadrant. The inflammatory process also involves the duodenum, pancreatic head, gallbladder, and hepatic flexure of the colon. No pneumatosis or extraluminal bowel gas. There is prominence of the wall of the gallbladder. There is also inflammatory change of the wall of the colon and duodenum. Fascia planes in the pancreatic head are blurred.  Lobulated benign appearing cysts in the liver are stable. Small amount of fluid density surrounds the spleen. Inflammatory changes within the lesser sac are also noted.  Stranding is seen along the right lower quadrant retroperitoneum extending from the right upper quadrant.  No evidence of pancreatic hemorrhage or necrosis.  Mild inflammatory change of the adrenal glands.  Stable appearance of the kidneys.  The common bile duct remains mildly dilated at 9.5 mm.  The endometrial stripe is prominent measuring 16 mm in diameter.  Multilevel degenerative disc disease in the lumbar spine. Vacuum is present at L4-5 and L5-S1. Bulging discs at L2-3, L3-4, and L5-S1.  No acute bony deformity.  IMPRESSION: Inflammatory process in the right upper quadrant is present as described affecting the pancreatic head, duodenum, hepatic flexure of the colon, and gallbladder.  Common bile duct is dilated. Correlate with liver function tests as for the possibility of biliary obstruction.  Degenerative changes in the lumbar spine.  Small right pleural effusion and basilar atelectasis.   Electronically Signed   By: Maryclare Bean M.D.   On: 05/18/2013 14:35    Scheduled Meds: . ALPRAZolam  0.5 mg Oral BID  . aspirin  81 mg Oral Daily  . dicyclomine  10 mg Oral BID  . diltiazem  240 mg Oral Daily  . enoxaparin (LOVENOX) injection  40 mg Subcutaneous Q24H  . levothyroxine  25 mcg Oral QAC breakfast  . metoprolol succinate  100 mg Oral Daily   . pantoprazole (PROTONIX) IV  40 mg Intravenous Q24H  . raloxifene  60 mg Oral Daily   Continuous Infusions: . sodium chloride      Principal Problem:   Pancreatitis, acute Active Problems:   HTN (hypertension)   Abdominal pain, epigastric, RUQ, LUQ   Unspecified hypothyroidism   HOCM (hypertrophic obstructive cardiomyopathy)   OSA (obstructive sleep apnea)   Transaminitis   Diastolic CHF   Hypokalemia    Time spent: 35 minutes.     Matei Magnone  [facility] Pager [phone]. If 7PM-7AM, please contact night-coverage at [email or web address], password [email or web address] 05/19/2013, 9:04 AM  LOS: 1 day

## 2013-05-19 NOTE — Progress Notes (Addendum)
Patient ID: Elizabeth Shields, female   DOB: 1932-02-03, 79 y.o.   MRN: [id number] [facility] Progress Note  Elizabeth Shields 79 y.o. Jul 26, 1932   Subjective: Sitting in chair. Denies abdominal pain/N/V. MRCP results noted.  Objective: Vital signs in last 24 hours: Filed Vitals:   05/19/13 0627  BP: 142/64  Pulse: 87  Temp: 98.5 F (36.9 C)  Resp: 18    Physical Exam: Gen: alert, no acute distress, elderly Abd: soft, nontender, nondistended  Lab Results:  Recent Labs  05/18/13 1200 05/18/13 2228 05/19/13 0545  NA 136  --  139  K 3.3*  --  4.2  CL 100  --  105  CO2 24  --  20  GLUCOSE 152*  --  90  BUN 19  --  12  CREATININE 1.20* 0.90 1.00  CALCIUM 9.7  --  9.0    Recent Labs  05/18/13 1200 05/19/13 0545  AST 33 24  ALT 89* 65*  ALKPHOS 120* 115  BILITOT 0.8 0.5  PROT 7.6 7.0  ALBUMIN 3.4* 2.9*    Recent Labs  05/18/13 1200 05/18/13 2228 05/19/13 0545  WBC 11.4* 11.9* 10.1  NEUTROABS 7.8*  --   --   HGB 12.6 12.3 12.4  HCT 37.8 35.7* 36.9  MCV 80.6 80.6 81.1  PLT 202 215 229   No results found for this basename: LABPROT, INR,  in the last 72 hours    Assessment/Plan: 79 yo with gallstone pancreatitis probably passed a stone and MRCP showing mild pancreatitis WITHOUT a common bile duct stone. Possible tiny gallstones in GB neck on MRCP. LFTs near normal. I think she needs to be evaluated for cholecystectomy since this is her second episode of pancreatitis this year and both were likely due to gallstones. Will start clears today. Recommend surgical consult to decide on GB surgery or not and timing of it. Dr. Fonnie Jarvis to see tomorrow. Answered patient and daughter's questions.   Elizabeth Shields C. 05/19/2013, 1:26 PM

## 2013-05-20 ENCOUNTER — Encounter (HOSPITAL_COMMUNITY): Payer: Medicare Other | Admitting: Anesthesiology

## 2013-05-20 ENCOUNTER — Inpatient Hospital Stay (HOSPITAL_COMMUNITY): Payer: Medicare Other | Admitting: Anesthesiology

## 2013-05-20 ENCOUNTER — Inpatient Hospital Stay (HOSPITAL_COMMUNITY): Payer: Medicare Other

## 2013-05-20 ENCOUNTER — Encounter (HOSPITAL_COMMUNITY): Payer: Self-pay | Admitting: Anesthesiology

## 2013-05-20 ENCOUNTER — Encounter (HOSPITAL_COMMUNITY)
Admission: EM | Disposition: A | Discharge status: Home / Self Care | Payer: Self-pay | Source: Home / Self Care | Attending: Internal Medicine

## 2013-05-20 DIAGNOSIS — K8010 Calculus of gallbladder with chronic cholecystitis without obstruction: Secondary | ICD-10-CM

## 2013-05-20 HISTORY — PX: CHOLECYSTECTOMY: SHX55

## 2013-05-20 LAB — BASIC METABOLIC PANEL
BUN: 11 mg/dL (ref 6–23)
Calcium: 9.1 mg/dL (ref 8.4–10.5)
Chloride: 109 mEq/L (ref 96–112)
GFR calc Af Amer: 62 mL/min — ABNORMAL LOW (ref >90)
GFR calc non Af Amer: 53 mL/min — ABNORMAL LOW (ref >90)
Glucose, Bld: 99 mg/dL (ref 70–99)
Sodium: 139 mEq/L (ref 135–145)

## 2013-05-20 LAB — SURGICAL PCR SCREEN
MRSA, PCR: POSITIVE — AB
Staphylococcus aureus: POSITIVE — AB

## 2013-05-20 LAB — HEPATIC FUNCTION PANEL
ALT: 45 U/L — ABNORMAL HIGH (ref 0–35)
AST: 20 U/L (ref 0–37)
Alkaline Phosphatase: 104 U/L (ref 39–117)
Bilirubin, Direct: 0.1 mg/dL (ref 0.0–0.3)
Indirect Bilirubin: 0.4 mg/dL (ref 0.3–0.9)
Total Bilirubin: 0.5 mg/dL (ref 0.3–1.2)

## 2013-05-20 LAB — CBC
HCT: 32 % — ABNORMAL LOW (ref 36.0–46.0)
MCH: 27.2 pg (ref 26.0–34.0)
MCHC: 33.4 g/dL (ref 30.0–36.0)
Platelets: 211 10*3/uL (ref 150–400)
RDW: 15 % (ref 11.5–15.5)
WBC: 7.4 10*3/uL (ref 4.0–10.5)

## 2013-05-20 LAB — GLUCOSE, CAPILLARY: Glucose-Capillary: 93 mg/dL (ref 70–99)

## 2013-05-20 SURGERY — LAPAROSCOPIC CHOLECYSTECTOMY WITH INTRAOPERATIVE CHOLANGIOGRAM
Anesthesia: General | Site: Abdomen | Wound class: Clean Contaminated

## 2013-05-20 MED ORDER — LACTATED RINGERS IV SOLN
INTRAVENOUS | Status: DC
  Administered 2013-05-20: 10:00:00 via INTRAVENOUS

## 2013-05-20 MED ORDER — NEOSTIGMINE METHYLSULFATE 1 MG/ML IJ SOLN
INTRAMUSCULAR | Status: DC | PRN
  Administered 2013-05-20: 3.5 mg via INTRAVENOUS

## 2013-05-20 MED ORDER — PHENYLEPHRINE HCL 10 MG/ML IJ SOLN
INTRAMUSCULAR | Status: DC | PRN
  Administered 2013-05-20: 40 ug via INTRAVENOUS

## 2013-05-20 MED ORDER — SODIUM CHLORIDE 0.9 % IV SOLN
INTRAVENOUS | Status: DC | PRN
  Administered 2013-05-20: 11:00:00

## 2013-05-20 MED ORDER — LABETALOL HCL 5 MG/ML IV SOLN
INTRAVENOUS | Status: DC | PRN
  Administered 2013-05-20: 5 mg via INTRAVENOUS

## 2013-05-20 MED ORDER — ONDANSETRON HCL 4 MG/2ML IJ SOLN: 4.0000 mg | Freq: Once | INTRAMUSCULAR | Status: DC | PRN

## 2013-05-20 MED ORDER — MUPIROCIN 2 % EX OINT
TOPICAL_OINTMENT | Freq: Two times a day (BID) | CUTANEOUS | Status: DC
  Administered 2013-05-20 – 2013-05-21 (×2): via NASAL
  Filled 2013-05-20 (×2): qty 22

## 2013-05-20 MED ORDER — ARTIFICIAL TEARS OP OINT
TOPICAL_OINTMENT | OPHTHALMIC | Status: DC | PRN
  Administered 2013-05-20: 1 via OPHTHALMIC

## 2013-05-20 MED ORDER — GLYCOPYRROLATE 0.2 MG/ML IJ SOLN
INTRAMUSCULAR | Status: DC | PRN
  Administered 2013-05-20: 0.4 mg via INTRAVENOUS

## 2013-05-20 MED ORDER — CHLORHEXIDINE GLUCONATE CLOTH 2 % EX PADS
6.0000 | MEDICATED_PAD | Freq: Every day | CUTANEOUS | Status: DC
  Administered 2013-05-20 – 2013-05-21 (×2): 6 via TOPICAL

## 2013-05-20 MED ORDER — ROCURONIUM BROMIDE 100 MG/10ML IV SOLN
INTRAVENOUS | Status: DC | PRN
  Administered 2013-05-20: 40 mg via INTRAVENOUS

## 2013-05-20 MED ORDER — LIDOCAINE HCL (CARDIAC) 20 MG/ML IV SOLN
INTRAVENOUS | Status: DC | PRN
  Administered 2013-05-20: 50 mg via INTRAVENOUS

## 2013-05-20 MED ORDER — SODIUM CHLORIDE 0.9 % IR SOLN
Status: DC | PRN
  Administered 2013-05-20: 1000 mL

## 2013-05-20 MED ORDER — LIDOCAINE HCL 4 % MT SOLN
OROMUCOSAL | Status: DC | PRN
  Administered 2013-05-20: 4 mL via TOPICAL

## 2013-05-20 MED ORDER — LACTATED RINGERS IV SOLN
INTRAVENOUS | Status: DC | PRN
  Administered 2013-05-20: 10:00:00 via INTRAVENOUS

## 2013-05-20 MED ORDER — BUPIVACAINE-EPINEPHRINE 0.5% -1:200000 IJ SOLN
INTRAMUSCULAR | Status: DC | PRN
  Administered 2013-05-20: 8 mL

## 2013-05-20 MED ORDER — ONDANSETRON HCL 4 MG/2ML IJ SOLN
INTRAMUSCULAR | Status: DC | PRN
  Administered 2013-05-20: 4 mg via INTRAVENOUS

## 2013-05-20 MED ORDER — FENTANYL CITRATE 0.05 MG/ML IJ SOLN
INTRAMUSCULAR | Status: DC | PRN
  Administered 2013-05-20: 100 ug via INTRAVENOUS
  Administered 2013-05-20: 50 ug via INTRAVENOUS

## 2013-05-20 MED ORDER — 0.9 % SODIUM CHLORIDE (POUR BTL) OPTIME
TOPICAL | Status: DC | PRN
  Administered 2013-05-20: 1000 mL

## 2013-05-20 MED ORDER — PROPOFOL 10 MG/ML IV BOLUS
INTRAVENOUS | Status: DC | PRN
  Administered 2013-05-20: 100 mg via INTRAVENOUS

## 2013-05-20 MED ORDER — HYDROMORPHONE HCL PF 1 MG/ML IJ SOLN: 0.2500 mg | INTRAMUSCULAR | Status: DC | PRN

## 2013-05-20 MED ORDER — BUPIVACAINE-EPINEPHRINE (PF) 0.5% -1:200000 IJ SOLN
INTRAMUSCULAR | Status: AC
  Filled 2013-05-20: qty 10

## 2013-05-20 MED ORDER — ALBUTEROL SULFATE HFA 108 (90 BASE) MCG/ACT IN AERS
INHALATION_SPRAY | RESPIRATORY_TRACT | Status: DC | PRN
  Administered 2013-05-20: 2 via RESPIRATORY_TRACT

## 2013-05-20 SURGICAL SUPPLY — 49 items
ADH SKN CLS APL DERMABOND .7 (GAUZE/BANDAGES/DRESSINGS) ×1
ADH SKN CLS LQ APL DERMABOND (GAUZE/BANDAGES/DRESSINGS) ×1
APPLIER CLIP ROT 10 11.4 M/L (STAPLE) ×2
APR CLP MED LRG 11.4X10 (STAPLE) ×1
BAG SPEC RTRVL LRG 6X4 10 (ENDOMECHANICALS) ×1
BLADE SURG ROTATE 9660 (MISCELLANEOUS) IMPLANT
CANISTER SUCTION 2500CC (MISCELLANEOUS) ×2 IMPLANT
CHLORAPREP W/TINT 26ML (MISCELLANEOUS) ×2 IMPLANT
CLIP APPLIE ROT 10 11.4 M/L (STAPLE) ×1 IMPLANT
COVER MAYO STAND STRL (DRAPES) ×2 IMPLANT
COVER SURGICAL LIGHT HANDLE (MISCELLANEOUS) ×2 IMPLANT
DECANTER SPIKE VIAL GLASS SM (MISCELLANEOUS) ×1 IMPLANT
DERMABOND ADHESIVE PROPEN (GAUZE/BANDAGES/DRESSINGS) ×1
DERMABOND ADVANCED (GAUZE/BANDAGES/DRESSINGS) ×1
DERMABOND ADVANCED .7 DNX12 (GAUZE/BANDAGES/DRESSINGS) ×1 IMPLANT
DERMABOND ADVANCED .7 DNX6 (GAUZE/BANDAGES/DRESSINGS) IMPLANT
DRAPE C-ARM 42X72 X-RAY (DRAPES) ×2 IMPLANT
DRAPE UTILITY 15X26 W/TAPE STR (DRAPE) ×4 IMPLANT
ELECT REM PT RETURN 9FT ADLT (ELECTROSURGICAL) ×2
ELECTRODE REM PT RTRN 9FT ADLT (ELECTROSURGICAL) ×1 IMPLANT
GLOVE BIO SURGEON STRL SZ 6.5 (GLOVE) ×1 IMPLANT
GLOVE BIOGEL PI IND STRL 6.5 (GLOVE) IMPLANT
GLOVE BIOGEL PI IND STRL 7.0 (GLOVE) IMPLANT
GLOVE BIOGEL PI IND STRL 8 (GLOVE) ×1 IMPLANT
GLOVE BIOGEL PI INDICATOR 6.5 (GLOVE) ×1
GLOVE BIOGEL PI INDICATOR 7.0 (GLOVE) ×2
GLOVE BIOGEL PI INDICATOR 8 (GLOVE) ×1
GLOVE SS BIOGEL STRL SZ 7.5 (GLOVE) ×1 IMPLANT
GLOVE SUPERSENSE BIOGEL SZ 7.5 (GLOVE) ×1
GLOVE SURG SS PI 7.0 STRL IVOR (GLOVE) ×1 IMPLANT
GOWN STRL NON-REIN LRG LVL3 (GOWN DISPOSABLE) ×5 IMPLANT
GOWN STRL REIN XL XLG (GOWN DISPOSABLE) ×2 IMPLANT
KIT BASIN OR (CUSTOM PROCEDURE TRAY) ×2 IMPLANT
KIT ROOM TURNOVER OR (KITS) ×2 IMPLANT
NS IRRIG 1000ML POUR BTL (IV SOLUTION) ×2 IMPLANT
PAD ARMBOARD 7.5X6 YLW CONV (MISCELLANEOUS) ×2 IMPLANT
POUCH SPECIMEN RETRIEVAL 10MM (ENDOMECHANICALS) ×1 IMPLANT
SCISSORS LAP 5X35 DISP (ENDOMECHANICALS) ×1 IMPLANT
SET CHOLANGIOGRAPH 5 50 .035 (SET/KITS/TRAYS/PACK) ×2 IMPLANT
SET IRRIG TUBING LAPAROSCOPIC (IRRIGATION / IRRIGATOR) ×2 IMPLANT
SLEEVE ENDOPATH XCEL 5M (ENDOMECHANICALS) ×2 IMPLANT
SPECIMEN JAR SMALL (MISCELLANEOUS) ×2 IMPLANT
SUT MON AB 5-0 PS2 18 (SUTURE) ×2 IMPLANT
TOWEL OR 17X24 6PK STRL BLUE (TOWEL DISPOSABLE) ×1 IMPLANT
TOWEL OR 17X26 10 PK STRL BLUE (TOWEL DISPOSABLE) ×2 IMPLANT
TRAY LAPAROSCOPIC (CUSTOM PROCEDURE TRAY) ×2 IMPLANT
TROCAR XCEL BLUNT TIP 100MML (ENDOMECHANICALS) ×2 IMPLANT
TROCAR XCEL NON-BLD 11X100MML (ENDOMECHANICALS) ×2 IMPLANT
TROCAR XCEL NON-BLD 5MMX100MML (ENDOMECHANICALS) ×2 IMPLANT

## 2013-05-20 NOTE — Progress Notes (Signed)
[facility] PROGRESS NOTE  Elizabeth Shields JXB:[id number] DOB: 01-19-1932 DOA: 05/18/2013 PCP: No primary provider on file.  Assessment/Plan: 1.Gall-stone Pancreatitis: Patient presents with abdominal pain, nausea. Mild increase lipase, transaminases.  -CT abdomen: Inflammatory process in the right upper quadrant is present as described affecting the pancreatic head, duodenum, hepatic flexure of the colon, and gallbladder. Common bile duct is dilated.  -MRCP: Mildly distended tortuous and redundant gallbladder with possible tiny stones in the neck region. No common bile duct stones. Borderline common bile duct dilatation at 9 mm. -Patient S/P Cholecystectomy 10-20. Tolerated procedure well.  -Continue with protonix, prn pain medication. Clear diet.   2. Transaminitis- secondary to number 1. Trending down.  3. Hypokalemia- Resolved.  4. HTN- continue Metoprolol, and Diltiazem Rx.  5. Hypothyroid- Continue Levothyroxine. 7. HOCM- stable . NSL.  8. OSA-  9. Diastolic CHF due to #7, Stable on meds. Tolerating clear diet. Will NSL fluids.  10. Hyperlipidemia- hold statin due to transaminases.  11. DVT prophylaxis with Lovenox.  12-Pyurea: continue with ceftriaxone day 2/3; urine culture no growth.   Code Status: Full Code.  Family Communication: Care discussed with daughter.  Disposition Plan: to be determine.    Consultants:  Surgery.  GI, [facility].   Procedures: MRCP: Acute pancreatitis. Mildly distended tortuous and redundant gallbladder with possible tiny stones in the neck region. No common bile duct stones. Borderline common bile duct dilatation  at 9 mm.   Antibiotics:  Ceftriaxone 10-18  HPI/Subjective: No abdominal pain. Feeling better. No dyspnea.   Objective: Filed Vitals:   05/20/13 1239  BP: 150/57  Pulse: 60  Temp: 98.1 F (36.7 C)  Resp: 16    Intake/Output Summary (Last 24 hours) at 05/20/13 1336 Last data filed at 05/20/13 1200  Gross per  24 hour  Intake    840 ml  Output   1150 ml  Net   -310 ml   Filed Weights   05/18/13 1120  Weight: 64.864 kg (143 lb)    Exam:   General: No distress.   Cardiovascular: S 1, S 2 RRR  Respiratory: CTA  Abdomen: BS present, soft, NT, incision site clean.   Musculoskeletal: no edema.    Data Reviewed: Basic Metabolic Panel:  Recent Labs Lab 05/18/13 1200 05/18/13 2228 05/19/13 0545 05/20/13 0541  NA 136  --  139 139  K 3.3*  --  4.2 3.8  CL 100  --  105 109  CO2 24  --  20 22  GLUCOSE 152*  --  90 99  BUN 19  --  12 11  CREATININE 1.20* 0.90 1.00 0.98  CALCIUM 9.7  --  9.0 9.1   Liver Function Tests:  Recent Labs Lab 05/18/13 1200 05/19/13 0545 05/20/13 0541  AST 33 24 20  ALT 89* 65* 45*  ALKPHOS 120* 115 104  BILITOT 0.8 0.5 0.5  PROT 7.6 7.0 6.5  ALBUMIN 3.4* 2.9* 2.6*    Recent Labs Lab 05/18/13 1200 05/19/13 0500  LIPASE 139* 134*   No results found for this basename: AMMONIA,  in the last 168 hours CBC:  Recent Labs Lab 05/18/13 1200 05/18/13 2228 05/19/13 0545 05/20/13 0541  WBC 11.4* 11.9* 10.1 7.4  NEUTROABS 7.8*  --   --   --   HGB 12.6 12.3 12.4 10.7*  HCT 37.8 35.7* 36.9 32.0*  MCV 80.6 80.6 81.1 81.2  PLT 202 215 229 211   Cardiac Enzymes: No results found for this basename: CKTOTAL, CKMB, CKMBINDEX,  TROPONINI,  in the last 168 hours BNP (last 3 results)  Recent Labs  11/07/12 0530  PROBNP 2671.0*   CBG:  Recent Labs Lab 05/20/13 0928  GLUCAP 93    Recent Results (from the past 240 hour(s))  URINE CULTURE     Status: None   Collection Time    05/18/13 11:38 AM      Result Value Range Status   Specimen Description URINE, CLEAN CATCH   Final   Special Requests NONE   Final   Culture  Setup Time     Final   Value: 05/18/2013 18:53     Performed at [facility] Count     Final   Value: NO GROWTH     Performed at [facility]   Culture     Final   Value: NO GROWTH     Performed  at [facility]   Report Status 05/19/2013 FINAL   Final  SURGICAL PCR SCREEN     Status: Abnormal   Collection Time    05/20/13  9:06 AM      Result Value Range Status   MRSA, PCR POSITIVE (*) NEGATIVE Final   Staphylococcus aureus POSITIVE (*) NEGATIVE Final   Comment:            The Xpert SA Assay (FDA     approved for NASAL specimens     in patients over 51 years of age),     is one component of     a comprehensive surveillance     program.  Test performance has     been validated by [employer] for patients greater     than or equal to 63 year old.     It is not intended     to diagnose infection nor to     guide or monitor treatment.     Studies: Ct Abdomen Pelvis W Contrast  05/18/2013   CLINICAL DATA:  Abdominal pain  EXAM: CT ABDOMEN AND PELVIS WITH CONTRAST  TECHNIQUE: Multidetector CT imaging of the abdomen and pelvis was performed using the standard protocol following bolus administration of intravenous contrast.  CONTRAST:  50mL OMNIPAQUE IOHEXOL 300 MG/ML SOLN, OMNIPAQUE IOHEXOL 300 MG/ML SOLN  COMPARISON:  11/05/2012  FINDINGS: Subsegmental atelectasis is present at both lung bases right greater than left. Tiny right pleural effusion.  Stable coronary artery calcifications.  There is stranding within the intraperitoneal fat of the right upper quadrant. The inflammatory process also involves the duodenum, pancreatic head, gallbladder, and hepatic flexure of the colon. No pneumatosis or extraluminal bowel gas. There is prominence of the wall of the gallbladder. There is also inflammatory change of the wall of the colon and duodenum. Fascia planes in the pancreatic head are blurred.  Lobulated benign appearing cysts in the liver are stable. Small amount of fluid density surrounds the spleen. Inflammatory changes within the lesser sac are also noted.  Stranding is seen along the right lower quadrant retroperitoneum extending from the right upper quadrant.  No  evidence of pancreatic hemorrhage or necrosis.  Mild inflammatory change of the adrenal glands.  Stable appearance of the kidneys.  The common bile duct remains mildly dilated at 9.5 mm.  The endometrial stripe is prominent measuring 16 mm in diameter.  Multilevel degenerative disc disease in the lumbar spine. Vacuum is present at L4-5 and L5-S1. Bulging discs at L2-3, L3-4, and L5-S1.  No acute bony deformity.  IMPRESSION: Inflammatory process in the right upper quadrant is present as described affecting the pancreatic head, duodenum, hepatic flexure of the colon, and gallbladder.  Common bile duct is dilated. Correlate with liver function tests as for the possibility of biliary obstruction.  Degenerative changes in the lumbar spine.  Small right pleural effusion and basilar atelectasis.   Electronically Signed   By: Maryclare Bean M.D.   On: 05/18/2013 14:35   Mr Abdomen Mrcp Wo Cm  05/19/2013   CLINICAL DATA:  Pancreatitis.  EXAM: MRI ABDOMEN WITHOUT  (INCLUDING MRCP)  TECHNIQUE: Multiplanar multisequence MR imaging of the abdomen was performed. Heavily T2-weighted images of the biliary and pancreatic ducts were obtained, and three-dimensional MRCP images were rendered by post processing.  COMPARISON:  CT scan 05/18/2013.  FINDINGS: There are small bilateral pleural effusions and bibasilar atelectasis.  The liver demonstrates complex cysts in the right and left hepatic lobes. These are unchanged since the April CT scan. No intrahepatic biliary dilatation. The common bile duct is normal in caliber and has a normal course. The main pancreatic duct is normal. No common bile duct stones are identified. The maximum diameter of the common bowel duct and head of the pancreas is 9 mm. This is borderline enlarged for age. No ampullary mass or pancreatic head mass is identified. There may be a few tiny gallstones near the neck of the gallbladder. The gallbladder is tortuous and somewhat redundant.  Pancreas demonstrates  mild diffuse inflammation consistent with acute pancreatitis. There is edema, fluid and inflammatory phlegmon surrounding the pancreas. The spleen is normal in size. No focal lesions. The adrenal glands and kidneys are unremarkable.  The aorta is normal in caliber. No mesenteric or retroperitoneal mass or adenopathy. The visualized bowel is unremarkable.  IMPRESSION: Acute pancreatitis.  Mildly distended tortuous and redundant gallbladder with possible tiny stones in the neck region.  No common bile duct stones. Borderline common bile duct dilatation at 9 mm.  The main pancreatic duct is normal. No pancreatic head mass or ampullary lesion.  Benign-appearing but complex hepatic cysts.   Electronically Signed   By: Loralie Champagne M.D.   On: 05/19/2013 11:28   Mr 3d Recon At Scanner  05/19/2013   CLINICAL DATA:  Pancreatitis.  EXAM: MRI ABDOMEN WITHOUT  (INCLUDING MRCP)  TECHNIQUE: Multiplanar multisequence MR imaging of the abdomen was performed. Heavily T2-weighted images of the biliary and pancreatic ducts were obtained, and three-dimensional MRCP images were rendered by post processing.  COMPARISON:  CT scan 05/18/2013.  FINDINGS: There are small bilateral pleural effusions and bibasilar atelectasis.  The liver demonstrates complex cysts in the right and left hepatic lobes. These are unchanged since the April CT scan. No intrahepatic biliary dilatation. The common bile duct is normal in caliber and has a normal course. The main pancreatic duct is normal. No common bile duct stones are identified. The maximum diameter of the common bowel duct and head of the pancreas is 9 mm. This is borderline enlarged for age. No ampullary mass or pancreatic head mass is identified. There may be a few tiny gallstones near the neck of the gallbladder. The gallbladder is tortuous and somewhat redundant.  Pancreas demonstrates mild diffuse inflammation consistent with acute pancreatitis. There is edema, fluid and inflammatory  phlegmon surrounding the pancreas. The spleen is normal in size. No focal lesions. The adrenal glands and kidneys are unremarkable.  The aorta is normal in caliber. No mesenteric or retroperitoneal mass or adenopathy. The visualized bowel is unremarkable.  IMPRESSION: Acute pancreatitis.  Mildly distended tortuous and redundant gallbladder with possible tiny stones in the neck region.  No common bile duct stones. Borderline common bile duct dilatation at 9 mm.  The main pancreatic duct is normal. No pancreatic head mass or ampullary lesion.  Benign-appearing but complex hepatic cysts.   Electronically Signed   By: Loralie Champagne M.D.   On: 05/19/2013 11:28    Scheduled Meds: . [MAR HOLD] ALPRAZolam  0.5 mg Oral BID  . [facility] HOLD] aspirin  81 mg Oral Daily  . Chlorhexidine Gluconate Cloth  6 each Topical Q0600  . [facility] HOLD] dicyclomine  10 mg Oral BID  . [facility] HOLD] diltiazem  240 mg Oral Daily  . [MAR HOLD] enoxaparin (LOVENOX) injection  40 mg Subcutaneous Q24H  . [facility] HOLD] levothyroxine  25 mcg Oral QAC breakfast  . [MAR HOLD] metoprolol succinate  100 mg Oral Daily  . mupirocin ointment   Nasal BID  . [MAR HOLD] pantoprazole (PROTONIX) IV  40 mg Intravenous Q24H  . [facility] HOLD] raloxifene  60 mg Oral Daily   Continuous Infusions:    Principal Problem:   Pancreatitis, acute Active Problems:   HTN (hypertension)   Abdominal pain, epigastric, RUQ, LUQ   Unspecified hypothyroidism   HOCM (hypertrophic obstructive cardiomyopathy)   OSA (obstructive sleep apnea)   Transaminitis   Diastolic CHF   Hypokalemia    Time spent: 25 minutes.     Rabecka Brendel  [facility] Pager [phone]. If 7PM-7AM, please contact night-coverage at [email or web address], password [facility] 05/20/2013, 1:36 PM  LOS: 2 days

## 2013-05-20 NOTE — Anesthesia Procedure Notes (Signed)
Procedure Name: Intubation Date/Time: 05/20/2013 10:13 AM Performed by: Ellin Goodie Pre-anesthesia Checklist: Patient identified, Suction available, Emergency Drugs available, Patient being monitored and Timeout performed Patient Re-evaluated:Patient Re-evaluated prior to inductionOxygen Delivery Method: Circle system utilized Preoxygenation: Pre-oxygenation with 100% oxygen Intubation Type: IV induction Ventilation: Mask ventilation without difficulty and Oral airway inserted - appropriate to patient size Laryngoscope Size: Mac and 3 Grade View: Grade I Tube type: Oral Tube size: 7.5 mm Number of attempts: 1 Airway Equipment and Method: Stylet Secured at: 21 cm Tube secured with: Tape Dental Injury: Teeth and Oropharynx as per pre-operative assessment  Comments: Easy induction and intubation with MAC 3 blade by [facility] student under the direction of Dr. Katrinka Blazing.  Dr. Katrinka Blazing verified placement of ETT.  Carlynn Herald, CRNA

## 2013-05-20 NOTE — Progress Notes (Addendum)
Returned to room from PACU, daughter at bedside, reoriented to room and surroundings, denies nausea/pain at this time

## 2013-05-20 NOTE — Transfer of Care (Signed)
Immediate Anesthesia Transfer of Care Note  Patient: Elizabeth Shields  Procedure(s) Performed: Procedure(s): LAPAROSCOPIC CHOLECYSTECTOMY WITH INTRAOPERATIVE CHOLANGIOGRAM (N/A)  Patient Location: PACU  Anesthesia Type:General  Level of Consciousness: awake, alert  and oriented  Airway & Oxygen Therapy: Patient connected to face mask oxygen  Post-op Assessment: Report given to PACU RN  Post vital signs: stable  Complications: No apparent anesthesia complications

## 2013-05-20 NOTE — Progress Notes (Signed)
Patient ID: Elizabeth Shields, female   DOB: [birth year], 79 y.o.   MRN: [id number]    Subjective: Patient feels better today. She had a little diarrhea this morning but denies abdominal pain.  Objective: Vital signs in last 24 hours: Temp:  [98.2 F (36.8 C)-99 F (37.2 C)] 98.2 F (36.8 C) (10/20 0555) Pulse Rate:  [58-69] 58 (10/20 0555) Resp:  [16-18] 16 (10/20 0555) BP: (120-136)/(58-61) 120/58 mmHg (10/20 0555) SpO2:  [96 %-98 %] 96 % (10/20 0555) Last BM Date: 05/18/13  Intake/Output from previous day: 10/19 0701 - 10/20 0700 In: 240 [P.O.:240] Out: 800 [Urine:800] Intake/Output this shift:    General appearance: alert, cooperative and no distress Resp: clear to auscultation bilaterally GI: minimal epigastric tenderness. No mass or distention.  Lab Results:   Recent Labs  05/19/13 0545 05/20/13 0541  WBC 10.1 7.4  HGB 12.4 10.7*  HCT 36.9 32.0*  PLT 229 211   BMET  Recent Labs  05/19/13 0545 05/20/13 0541  NA 139 139  K 4.2 3.8  CL 105 109  CO2 20 22  GLUCOSE 90 99  BUN 12 11  CREATININE 1.00 0.98  CALCIUM 9.0 9.1     Studies/Results: Ct Abdomen Pelvis W Contrast  05/18/2013   CLINICAL DATA:  Abdominal pain  EXAM: CT ABDOMEN AND PELVIS WITH CONTRAST  TECHNIQUE: Multidetector CT imaging of the abdomen and pelvis was performed using the standard protocol following bolus administration of intravenous contrast.  CONTRAST:  50mL OMNIPAQUE IOHEXOL 300 MG/ML SOLN, OMNIPAQUE IOHEXOL 300 MG/ML SOLN  COMPARISON:  11/05/2012  FINDINGS: Subsegmental atelectasis is present at both lung bases right greater than left. Tiny right pleural effusion.  Stable coronary artery calcifications.  There is stranding within the intraperitoneal fat of the right upper quadrant. The inflammatory process also involves the duodenum, pancreatic head, gallbladder, and hepatic flexure of the colon. No pneumatosis or extraluminal bowel gas. There is prominence of the wall of the  gallbladder. There is also inflammatory change of the wall of the colon and duodenum. Fascia planes in the pancreatic head are blurred.  Lobulated benign appearing cysts in the liver are stable. Small amount of fluid density surrounds the spleen. Inflammatory changes within the lesser sac are also noted.  Stranding is seen along the right lower quadrant retroperitoneum extending from the right upper quadrant.  No evidence of pancreatic hemorrhage or necrosis.  Mild inflammatory change of the adrenal glands.  Stable appearance of the kidneys.  The common bile duct remains mildly dilated at 9.5 mm.  The endometrial stripe is prominent measuring 16 mm in diameter.  Multilevel degenerative disc disease in the lumbar spine. Vacuum is present at L4-5 and L5-S1. Bulging discs at L2-3, L3-4, and L5-S1.  No acute bony deformity.  IMPRESSION: Inflammatory process in the right upper quadrant is present as described affecting the pancreatic head, duodenum, hepatic flexure of the colon, and gallbladder.  Common bile duct is dilated. Correlate with liver function tests as for the possibility of biliary obstruction.  Degenerative changes in the lumbar spine.  Small right pleural effusion and basilar atelectasis.   Electronically Signed   By: Maryclare Bean M.D.   On: 05/18/2013 14:35   Mr Abdomen Mrcp Wo Cm  05/19/2013   CLINICAL DATA:  Pancreatitis.  EXAM: MRI ABDOMEN WITHOUT  (INCLUDING MRCP)  TECHNIQUE: Multiplanar multisequence MR imaging of the abdomen was performed. Heavily T2-weighted images of the biliary and pancreatic ducts were obtained, and three-dimensional MRCP images were rendered  by post processing.  COMPARISON:  CT scan 05/18/2013.  FINDINGS: There are small bilateral pleural effusions and bibasilar atelectasis.  The liver demonstrates complex cysts in the right and left hepatic lobes. These are unchanged since the April CT scan. No intrahepatic biliary dilatation. The common bile duct is normal in caliber and has  a normal course. The main pancreatic duct is normal. No common bile duct stones are identified. The maximum diameter of the common bowel duct and head of the pancreas is 9 mm. This is borderline enlarged for age. No ampullary mass or pancreatic head mass is identified. There may be a few tiny gallstones near the neck of the gallbladder. The gallbladder is tortuous and somewhat redundant.  Pancreas demonstrates mild diffuse inflammation consistent with acute pancreatitis. There is edema, fluid and inflammatory phlegmon surrounding the pancreas. The spleen is normal in size. No focal lesions. The adrenal glands and kidneys are unremarkable.  The aorta is normal in caliber. No mesenteric or retroperitoneal mass or adenopathy. The visualized bowel is unremarkable.  IMPRESSION: Acute pancreatitis.  Mildly distended tortuous and redundant gallbladder with possible tiny stones in the neck region.  No common bile duct stones. Borderline common bile duct dilatation at 9 mm.  The main pancreatic duct is normal. No pancreatic head mass or ampullary lesion.  Benign-appearing but complex hepatic cysts.   Electronically Signed   By: Loralie Champagne M.D.   On: 05/19/2013 11:28   Mr 3d Recon At Scanner  05/19/2013   CLINICAL DATA:  Pancreatitis.  EXAM: MRI ABDOMEN WITHOUT  (INCLUDING MRCP)  TECHNIQUE: Multiplanar multisequence MR imaging of the abdomen was performed. Heavily T2-weighted images of the biliary and pancreatic ducts were obtained, and three-dimensional MRCP images were rendered by post processing.  COMPARISON:  CT scan 05/18/2013.  FINDINGS: There are small bilateral pleural effusions and bibasilar atelectasis.  The liver demonstrates complex cysts in the right and left hepatic lobes. These are unchanged since the April CT scan. No intrahepatic biliary dilatation. The common bile duct is normal in caliber and has a normal course. The main pancreatic duct is normal. No common bile duct stones are identified. The  maximum diameter of the common bowel duct and head of the pancreas is 9 mm. This is borderline enlarged for age. No ampullary mass or pancreatic head mass is identified. There may be a few tiny gallstones near the neck of the gallbladder. The gallbladder is tortuous and somewhat redundant.  Pancreas demonstrates mild diffuse inflammation consistent with acute pancreatitis. There is edema, fluid and inflammatory phlegmon surrounding the pancreas. The spleen is normal in size. No focal lesions. The adrenal glands and kidneys are unremarkable.  The aorta is normal in caliber. No mesenteric or retroperitoneal mass or adenopathy. The visualized bowel is unremarkable.  IMPRESSION: Acute pancreatitis.  Mildly distended tortuous and redundant gallbladder with possible tiny stones in the neck region.  No common bile duct stones. Borderline common bile duct dilatation at 9 mm.  The main pancreatic duct is normal. No pancreatic head mass or ampullary lesion.  Benign-appearing but complex hepatic cysts.   Electronically Signed   By: Loralie Champagne M.D.   On: 05/19/2013 11:28    Anti-infectives: Anti-infectives   Start     Dose/Rate Route Frequency Ordered Stop   05/19/13 1600  cefTRIAXone (ROCEPHIN) 1 g in dextrose 5 % 50 mL IVPB     1 g 100 mL/hr over 30 Minutes Intravenous Daily 05/19/13 1414     05/18/13 1600  cefTRIAXone (ROCEPHIN) 1 g in dextrose 5 % 50 mL IVPB     1 g 100 mL/hr over 30 Minutes Intravenous  Once 05/18/13 1546 05/18/13 1627   05/18/13 1551  cefTRIAXone (ROCEPHIN) 1 G injection    Comments:  Aleene Davidson   : cabinet override      05/18/13 1551 05/19/13 0359      Assessment/Plan: Apparent gallstone pancreatitis, resolving clinically. This is her second episode. We again discussed laparoscopic cholecystectomy in an effort to prevent recurrent attacks of pancreatitis.I discussed the procedure in detail with the patient and her daughter. We discussed the risks and benefits of a laparoscopic  cholecystectomy and possible cholangiogram including, but not limited to bleeding, infection, injury to surrounding structures such as the intestine or liver, bile leak, retained gallstones, need to convert to an open procedure, prolonged diarrhea, blood clots such as  DVT, common bile duct injury, anesthesia risks, and possible need for additional procedures.  The likelihood of improvement in symptoms and return to the patient's normal status is good. We discussed the typical post-operative recovery course. She has a history of congestive heart failure but records indicate a normal ejection fraction and she denies any symptoms of chest pain or shortness of breath on exertion. She would appear to be an acceptable operative risk.  plan to proceed with cholecystectomy today.    LOS: 2 days    Kipton Skillen T 05/20/2013

## 2013-05-20 NOTE — Progress Notes (Signed)
Dr. Laverle Hobby at bedside, informed of 20G IV in right antecubital that runs well.

## 2013-05-20 NOTE — Anesthesia Postprocedure Evaluation (Signed)
  Anesthesia Post-op Note  Patient: Elizabeth Shields  Procedure(s) Performed: Procedure(s): LAPAROSCOPIC CHOLECYSTECTOMY WITH INTRAOPERATIVE CHOLANGIOGRAM (N/A)  Patient Location: PACU  Anesthesia Type:General  Level of Consciousness: awake, alert , oriented and patient cooperative  Airway and Oxygen Therapy: Patient Spontanous Breathing  Post-op Pain: mild  Post-op Assessment: Post-op Vital signs reviewed, Patient's Cardiovascular Status Stable, Respiratory Function Stable, Patent Airway, No signs of Nausea or vomiting and Pain level controlled  Post-op Vital Signs: stable  Complications: No apparent anesthesia complications

## 2013-05-20 NOTE — Op Note (Signed)
Preoperative Diagnosis: gallstone pancreatitis  Postoprative Diagnosis: same  Procedure: Procedure(s): LAPAROSCOPIC CHOLECYSTECTOMY WITH INTRAOPERATIVE CHOLANGIOGRAM   Surgeon: Glenna Fellows T   Assistants: None  Anesthesia:  General endotracheal anesthesia  Indications: patient is a 79 year old female who presented several days ago with her second episode of acute pancreatitis and has had an MRI showing cholelithiasis. She has improved and we recommended proceeding with laparoscopic cholecystectomy and cholangiogram due to recurrent gallstone pancreatitis. The indications for the procedure and risks have been discussed extensively and detailed elsewhere. She is now brought to the operating room for this procedure.  Procedure Detail: patient was brought to the operating room, placed in supine position on operating table, and general endotracheal anesthesia induced. The abdomen was widely sterilely prepped and draped. Patient timeout was performed and correct procedure verified. PAS were in place and she had received preoperative IV antibiotics. Access was obtained a 1 cm incision at the umbilicus with an open Hassan technique through mattress suture of 0 Vicryl without difficulty and pneumoperitoneum established. There were noted to be moderately extensive omental adhesions in the upper abdomen possibly from previous pancreatitis. 25 mm trochars were placed under direct vision laterally in the right upper quadrant adhesions were taken down over to the midline and all adhesions over the liver taken down. The gallbladder was somewhat thickened and very redundant but not acutely inflamed. An 11 mm trocar was placed subxiphoid. The  fundus was retracted over the liver and in the infundibulum retracted inferolaterally. The fibrofatty tissue was stripped of the gallbladder toward the porta hepatus. The distal gallbladder and common duct and cystic duct were thoroughly dissected and a good critical view  obtained. The cystic duct gallbladder junction and an operative cholangiogram obtained through the cystic duct. This showed some moderately dilated bile ducts with good filling of the intrahepatic ducts and common bile duct and free flow into the duodenum without filling defects. The Cholangiocath removed and the cystic duct was triply clipped proximally. The cystic artery was doubly clipped proximally clipped distally and divided. The gallbladder was dissected free from its attachments to the liver and placed in an Endo Catch bag and removed through the umbilical incision. Complete hemostasis was obtained and he had been thoroughly irrigated. Trochars were removed and all CO2 evacuated and the mattress suture secured at the umbilicus. Skin incisions were closed with subcuticular Monocryl and Dermabond. Sponge needle and instrument counts were correct.    Findings: As above  Estimated Blood Loss:  Minimal         Drains: none  Blood Given: none          Specimens: gallbladder and contents        Complications:  * No complications entered in OR log *         Disposition: PACU - hemodynamically stable.         Condition: stable

## 2013-05-20 NOTE — Progress Notes (Signed)
[facility] PROGRESS NOTE Subjective patient clinically feels much better surgery planned for later this afternoon  Objective: Vital signs in last 24 hours: Temp:  [98.2 F (36.8 C)-99 F (37.2 C)] 98.2 F (36.8 C) (10/20 0555) Pulse Rate:  [58-69] 58 (10/20 0555) Resp:  [16-18] 16 (10/20 0555) BP: (120-136)/(58-61) 120/58 mmHg (10/20 0555) SpO2:  [96 %-98 %] 96 % (10/20 0555) Last BM Date: 05/18/13  Intake/Output from previous day: 10/19 0701 - 10/20 0700 In: 240 [P.O.:240] Out: 800 [Urine:800] Intake/Output this shift: Total I/O In: -  Out: 550 [Urine:550]  PE:  Abdomen-- minimal tenderness  Lab Results:  Recent Labs  05/18/13 1200 05/18/13 2228 05/19/13 0545 05/20/13 0541  WBC 11.4* 11.9* 10.1 7.4  HGB 12.6 12.3 12.4 10.7*  HCT 37.8 35.7* 36.9 32.0*  PLT 202 215 229 211   BMET  Recent Labs  05/18/13 1200 05/18/13 2228 05/19/13 0545 05/20/13 0541  NA 136  --  139 139  K 3.3*  --  4.2 3.8  CL 100  --  105 109  CO2 24  --  20 22  CREATININE 1.20* 0.90 1.00 0.98   LFT  Recent Labs  05/18/13 1200 05/19/13 0545 05/20/13 0541  PROT 7.6 7.0 6.5  AST 33 24 20  ALT 89* 65* 45*  ALKPHOS 120* 115 104  BILITOT 0.8 0.5 0.5  BILIDIR  --  0.2 0.1  IBILI  --  0.3 0.4   PT/INR No results found for this basename: LABPROT, INR,  in the last 72 hours PANCREAS  Recent Labs  05/18/13 1200 05/19/13 0500  LIPASE 139* 134*         Studies/Results: Ct Abdomen Pelvis W Contrast  05/18/2013   CLINICAL DATA:  Abdominal pain  EXAM: CT ABDOMEN AND PELVIS WITH CONTRAST  TECHNIQUE: Multidetector CT imaging of the abdomen and pelvis was performed using the standard protocol following bolus administration of intravenous contrast.  CONTRAST:  50mL OMNIPAQUE IOHEXOL 300 MG/ML SOLN, OMNIPAQUE IOHEXOL 300 MG/ML SOLN  COMPARISON:  11/05/2012  FINDINGS: Subsegmental atelectasis is present at both lung bases right greater than left. Tiny right  pleural effusion.  Stable coronary artery calcifications.  There is stranding within the intraperitoneal fat of the right upper quadrant. The inflammatory process also involves the duodenum, pancreatic head, gallbladder, and hepatic flexure of the colon. No pneumatosis or extraluminal bowel gas. There is prominence of the wall of the gallbladder. There is also inflammatory change of the wall of the colon and duodenum. Fascia planes in the pancreatic head are blurred.  Lobulated benign appearing cysts in the liver are stable. Small amount of fluid density surrounds the spleen. Inflammatory changes within the lesser sac are also noted.  Stranding is seen along the right lower quadrant retroperitoneum extending from the right upper quadrant.  No evidence of pancreatic hemorrhage or necrosis.  Mild inflammatory change of the adrenal glands.  Stable appearance of the kidneys.  The common bile duct remains mildly dilated at 9.5 mm.  The endometrial stripe is prominent measuring 16 mm in diameter.  Multilevel degenerative disc disease in the lumbar spine. Vacuum is present at L4-5 and L5-S1. Bulging discs at L2-3, L3-4, and L5-S1.  No acute bony deformity.  IMPRESSION: Inflammatory process in the right upper quadrant is present as described affecting the pancreatic head, duodenum, hepatic flexure of the colon, and gallbladder.  Common bile duct is dilated. Correlate with liver function tests as for the possibility of biliary obstruction.  Degenerative changes  in the lumbar spine.  Small right pleural effusion and basilar atelectasis.   Electronically Signed   By: Maryclare Bean M.D.   On: 05/18/2013 14:35   Mr Abdomen Mrcp Wo Cm  05/19/2013   CLINICAL DATA:  Pancreatitis.  EXAM: MRI ABDOMEN WITHOUT  (INCLUDING MRCP)  TECHNIQUE: Multiplanar multisequence MR imaging of the abdomen was performed. Heavily T2-weighted images of the biliary and pancreatic ducts were obtained, and three-dimensional MRCP images were rendered by  post processing.  COMPARISON:  CT scan 05/18/2013.  FINDINGS: There are small bilateral pleural effusions and bibasilar atelectasis.  The liver demonstrates complex cysts in the right and left hepatic lobes. These are unchanged since the April CT scan. No intrahepatic biliary dilatation. The common bile duct is normal in caliber and has a normal course. The main pancreatic duct is normal. No common bile duct stones are identified. The maximum diameter of the common bowel duct and head of the pancreas is 9 mm. This is borderline enlarged for age. No ampullary mass or pancreatic head mass is identified. There may be a few tiny gallstones near the neck of the gallbladder. The gallbladder is tortuous and somewhat redundant.  Pancreas demonstrates mild diffuse inflammation consistent with acute pancreatitis. There is edema, fluid and inflammatory phlegmon surrounding the pancreas. The spleen is normal in size. No focal lesions. The adrenal glands and kidneys are unremarkable.  The aorta is normal in caliber. No mesenteric or retroperitoneal mass or adenopathy. The visualized bowel is unremarkable.  IMPRESSION: Acute pancreatitis.  Mildly distended tortuous and redundant gallbladder with possible tiny stones in the neck region.  No common bile duct stones. Borderline common bile duct dilatation at 9 mm.  The main pancreatic duct is normal. No pancreatic head mass or ampullary lesion.  Benign-appearing but complex hepatic cysts.   Electronically Signed   By: Loralie Champagne M.D.   On: 05/19/2013 11:28   Mr 3d Recon At Scanner  05/19/2013   CLINICAL DATA:  Pancreatitis.  EXAM: MRI ABDOMEN WITHOUT  (INCLUDING MRCP)  TECHNIQUE: Multiplanar multisequence MR imaging of the abdomen was performed. Heavily T2-weighted images of the biliary and pancreatic ducts were obtained, and three-dimensional MRCP images were rendered by post processing.  COMPARISON:  CT scan 05/18/2013.  FINDINGS: There are small bilateral pleural  effusions and bibasilar atelectasis.  The liver demonstrates complex cysts in the right and left hepatic lobes. These are unchanged since the April CT scan. No intrahepatic biliary dilatation. The common bile duct is normal in caliber and has a normal course. The main pancreatic duct is normal. No common bile duct stones are identified. The maximum diameter of the common bowel duct and head of the pancreas is 9 mm. This is borderline enlarged for age. No ampullary mass or pancreatic head mass is identified. There may be a few tiny gallstones near the neck of the gallbladder. The gallbladder is tortuous and somewhat redundant.  Pancreas demonstrates mild diffuse inflammation consistent with acute pancreatitis. There is edema, fluid and inflammatory phlegmon surrounding the pancreas. The spleen is normal in size. No focal lesions. The adrenal glands and kidneys are unremarkable.  The aorta is normal in caliber. No mesenteric or retroperitoneal mass or adenopathy. The visualized bowel is unremarkable.  IMPRESSION: Acute pancreatitis.  Mildly distended tortuous and redundant gallbladder with possible tiny stones in the neck region.  No common bile duct stones. Borderline common bile duct dilatation at 9 mm.  The main pancreatic duct is normal. No pancreatic head mass or  ampullary lesion.  Benign-appearing but complex hepatic cysts.   Electronically Signed   By: Loralie Champagne M.D.   On: 05/19/2013 11:28    Medications: I have reviewed the patient's current medications.  Assessment/Plan: 1. Gallstone pancreatitis. Clinically much improved in liver tests are nearly back to normal. MRCP does not suggest CBD stone. Suspect that she passed the stone. Please call if  IOC suggest CBD and ERCP needed  Semira Stoltzfus JR,Brinna Divelbiss L 05/20/2013, 8:43 AM

## 2013-05-20 NOTE — Anesthesia Preprocedure Evaluation (Addendum)
Anesthesia Evaluation  Patient identified by MRN, date of birth, ID band Patient awake    Reviewed: Allergy & Precautions, H&P , NPO status , Patient's Chart, lab work & pertinent test results, reviewed documented beta blocker date and time   Airway       Dental   Pulmonary sleep apnea and Continuous Positive Airway Pressure Ventilation ,          Cardiovascular hypertension, Pt. on medications and Pt. on home beta blockers +CHF     Neuro/Psych Anxiety Depression    GI/Hepatic GERD-  Medicated and Controlled,  Endo/Other  Hypothyroidism   Renal/GU      Musculoskeletal   Abdominal   Peds  Hematology   Anesthesia Other Findings HOC  Reproductive/Obstetrics                          Anesthesia Physical Anesthesia Plan  ASA: III  Anesthesia Plan: General   Post-op Pain Management:    Induction: Intravenous  Airway Management Planned: Oral ETT  Additional Equipment:   Intra-op Plan:   Post-operative Plan: Extubation in OR  Informed Consent: I have reviewed the patients History and Physical, chart, labs and discussed the procedure including the risks, benefits and alternatives for the proposed anesthesia with the patient or authorized representative who has indicated his/her understanding and acceptance.     Plan Discussed with: CRNA, Anesthesiologist and Surgeon  Anesthesia Plan Comments:         Anesthesia Quick Evaluation

## 2013-05-21 ENCOUNTER — Encounter (HOSPITAL_COMMUNITY): Payer: Self-pay | Admitting: General Surgery

## 2013-05-21 LAB — HEPATIC FUNCTION PANEL
ALT: 44 U/L — ABNORMAL HIGH (ref 0–35)
AST: 36 U/L (ref 0–37)
Alkaline Phosphatase: 91 U/L (ref 39–117)
Bilirubin, Direct: <0.1 mg/dL (ref 0.0–0.3)
Total Bilirubin: 0.3 mg/dL (ref 0.3–1.2)

## 2013-05-21 LAB — CBC
HCT: 33.2 % — ABNORMAL LOW (ref 36.0–46.0)
Hemoglobin: 10.8 g/dL — ABNORMAL LOW (ref 12.0–15.0)
MCHC: 32.5 g/dL (ref 30.0–36.0)
Platelets: 226 10*3/uL (ref 150–400)
RDW: 15.1 % (ref 11.5–15.5)
WBC: 9.3 10*3/uL (ref 4.0–10.5)

## 2013-05-21 LAB — BASIC METABOLIC PANEL
BUN: 7 mg/dL (ref 6–23)
Calcium: 8.9 mg/dL (ref 8.4–10.5)
Creatinine, Ser: 0.94 mg/dL (ref 0.50–1.10)
GFR calc Af Amer: 65 mL/min — ABNORMAL LOW (ref >90)
GFR calc non Af Amer: 56 mL/min — ABNORMAL LOW (ref >90)
Potassium: 4.6 mEq/L (ref 3.5–5.1)

## 2013-05-21 MED ORDER — OXYCODONE HCL 5 MG PO TABS: 5.0000 mg | ORAL_TABLET | ORAL | Status: DC | PRN

## 2013-05-21 MED ORDER — WHITE PETROLATUM GEL
Status: AC
  Filled 2013-05-21: qty 5

## 2013-05-21 MED ORDER — INFLUENZA VAC SPLIT QUAD 0.5 ML IM SUSP
0.5000 mL | INTRAMUSCULAR | Status: AC
  Administered 2013-05-21: 0.5 mL via INTRAMUSCULAR
  Filled 2013-05-21: qty 0.5

## 2013-05-21 NOTE — Progress Notes (Signed)
[facility] PROGRESS NOTE  RAYMIE TRANI ZOX:[id number] DOB: [birth year] DOA: 05/18/2013 PCP: No primary provider on file.  Assessment/Plan: 1.Gall-stone Pancreatitis: Patient presents with abdominal pain, nausea. Mild increase lipase, transaminases.  -CT abdomen: Inflammatory process in the right upper quadrant is present as described affecting the pancreatic head, duodenum, hepatic flexure of the colon, and gallbladder. Common bile duct is dilated.  -MRCP: Mildly distended tortuous and redundant gallbladder with possible tiny stones in the neck region. No common bile duct stones. Borderline common bile duct dilatation at 9 mm. -Patient S/P Cholecystectomy 10-20. Tolerated procedure well.  -Continue with protonix, prn pain medication. Clear diet.   2. Transaminitis- secondary to number 1. Trending down.  3. Hypokalemia- Resolved.  4. HTN- continue Metoprolol, and Diltiazem Rx.  5. Hypothyroid- Continue Levothyroxine. 7. HOCM- stable . NSL.  8. OSA-  9. Diastolic CHF due to #7, Stable on meds. Tolerating clear diet. Will NSL fluids.  10. Hyperlipidemia- hold statin due to transaminases.  11. DVT prophylaxis with Lovenox.  12-Pyurea: continue with ceftriaxone day 2/3; urine culture no growth.  OK to discharge from medicine point of view.  I discontinue potassium supplement from med rec.   Code Status: Full Code.  Family Communication: Care discussed with daughter.  Disposition Plan: to be determine.    Consultants:  Surgery.  GI, [facility].   Procedures: MRCP: Acute pancreatitis. Mildly distended tortuous and redundant gallbladder with possible tiny stones in the neck region. No common bile duct stones. Borderline common bile duct dilatation  at 9 mm.   Antibiotics:  Ceftriaxone 10-18  HPI/Subjective: Feeling well, no chest pain or dyspnea. Had BM after surgery.   Objective: Filed Vitals:   05/21/13 0606  BP: 112/44  Pulse: 66  Temp: 98.9 F (37.2 C)   Resp: 16    Intake/Output Summary (Last 24 hours) at 05/21/13 1045 Last data filed at 05/21/13 0943  Gross per 24 hour  Intake    840 ml  Output    300 ml  Net    540 ml   Filed Weights   05/18/13 1120  Weight: 64.864 kg (143 lb)    Exam:   General: No distress.   Cardiovascular: S 1, S 2 RRR  Respiratory: CTA  Abdomen: BS present, soft, NT, incision site clean.   Musculoskeletal: no edema.    Data Reviewed: Basic Metabolic Panel:  Recent Labs Lab 05/18/13 1200 05/18/13 2228 05/19/13 0545 05/20/13 0541 05/21/13 0530  NA 136  --  139 139 138  K 3.3*  --  4.2 3.8 4.6  CL 100  --  105 109 104  CO2 24  --  20 22 24   GLUCOSE 152*  --  90 99 100*  BUN 19  --  12 11 7   CREATININE 1.20* 0.90 1.00 0.98 0.94  CALCIUM 9.7  --  9.0 9.1 8.9   Liver Function Tests:  Recent Labs Lab 05/18/13 1200 05/19/13 0545 05/20/13 0541 05/21/13 0530  AST 33 24 20 36  ALT 89* 65* 45* 44*  ALKPHOS 120* 115 104 91  BILITOT 0.8 0.5 0.5 0.3  PROT 7.6 7.0 6.5 6.4  ALBUMIN 3.4* 2.9* 2.6* 2.7*    Recent Labs Lab 05/18/13 1200 05/19/13 0500  LIPASE 139* 134*   No results found for this basename: AMMONIA,  in the last 168 hours CBC:  Recent Labs Lab 05/18/13 1200 05/18/13 2228 05/19/13 0545 05/20/13 0541 05/21/13 0530  WBC 11.4* 11.9* 10.1 7.4 9.3  NEUTROABS 7.8*  --   --   --   --  HGB 12.6 12.3 12.4 10.7* 10.8*  HCT 37.8 35.7* 36.9 32.0* 33.2*  MCV 80.6 80.6 81.1 81.2 82.8  PLT 202 215 229 211 226   Cardiac Enzymes: No results found for this basename: CKTOTAL, CKMB, CKMBINDEX, TROPONINI,  in the last 168 hours BNP (last 3 results)  Recent Labs  11/07/12 0530  PROBNP 2671.0*   CBG:  Recent Labs Lab 05/20/13 0928  GLUCAP 93    Recent Results (from the past 240 hour(s))  URINE CULTURE     Status: None   Collection Time    05/18/13 11:38 AM      Result Value Range Status   Specimen Description URINE, CLEAN CATCH   Final   Special Requests NONE    Final   Culture  Setup Time     Final   Value: 05/18/2013 18:53     Performed at [facility] Count     Final   Value: NO GROWTH     Performed at [facility]   Culture     Final   Value: NO GROWTH     Performed at [facility]   Report Status 05/19/2013 FINAL   Final  SURGICAL PCR SCREEN     Status: Abnormal   Collection Time    05/20/13  9:06 AM      Result Value Range Status   MRSA, PCR POSITIVE (*) NEGATIVE Final   Staphylococcus aureus POSITIVE (*) NEGATIVE Final   Comment:            The Xpert SA Assay (FDA     approved for NASAL specimens     in patients over 77 years of age),     is one component of     a comprehensive surveillance     program.  Test performance has     been validated by [employer] for patients greater     than or equal to 77 year old.     It is not intended     to diagnose infection nor to     guide or monitor treatment.     Studies: Dg Cholangiogram Operative  05/20/2013   CLINICAL DATA:  Laparoscopic cholecystectomy  EXAM: INTRAOPERATIVE CHOLANGIOGRAM  TECHNIQUE: Cholangiographic images from the C-arm fluoroscopic device were submitted for interpretation post-operatively. Please see the procedural report for the amount of contrast and the fluoroscopy time utilized.  COMPARISON:  Preoperative MRI 05/20/2003  FINDINGS: There is opacification of the mildly irregular common duct with caliber at upper limits of normal but no filling defect identified or focal caliber abnormality. Excretion into the duodenum is noted.  IMPRESSION: Mildly irregular and tortuous common duct without filling defect identified. Not significantly changed since prior exam.   Electronically Signed   By: Christiana Pellant M.D.   On: 05/20/2013 15:39   Mr Abdomen Mrcp Wo Cm  05/19/2013   CLINICAL DATA:  Pancreatitis.  EXAM: MRI ABDOMEN WITHOUT  (INCLUDING MRCP)  TECHNIQUE: Multiplanar multisequence MR imaging of the abdomen was performed. Heavily  T2-weighted images of the biliary and pancreatic ducts were obtained, and three-dimensional MRCP images were rendered by post processing.  COMPARISON:  CT scan 05/18/2013.  FINDINGS: There are small bilateral pleural effusions and bibasilar atelectasis.  The liver demonstrates complex cysts in the right and left hepatic lobes. These are unchanged since the April CT scan. No intrahepatic biliary dilatation. The common bile duct is normal in caliber and has a normal  course. The main pancreatic duct is normal. No common bile duct stones are identified. The maximum diameter of the common bowel duct and head of the pancreas is 9 mm. This is borderline enlarged for age. No ampullary mass or pancreatic head mass is identified. There may be a few tiny gallstones near the neck of the gallbladder. The gallbladder is tortuous and somewhat redundant.  Pancreas demonstrates mild diffuse inflammation consistent with acute pancreatitis. There is edema, fluid and inflammatory phlegmon surrounding the pancreas. The spleen is normal in size. No focal lesions. The adrenal glands and kidneys are unremarkable.  The aorta is normal in caliber. No mesenteric or retroperitoneal mass or adenopathy. The visualized bowel is unremarkable.  IMPRESSION: Acute pancreatitis.  Mildly distended tortuous and redundant gallbladder with possible tiny stones in the neck region.  No common bile duct stones. Borderline common bile duct dilatation at 9 mm.  The main pancreatic duct is normal. No pancreatic head mass or ampullary lesion.  Benign-appearing but complex hepatic cysts.   Electronically Signed   By: Loralie Champagne M.D.   On: 05/19/2013 11:28   Mr 3d Recon At Scanner  05/19/2013   CLINICAL DATA:  Pancreatitis.  EXAM: MRI ABDOMEN WITHOUT  (INCLUDING MRCP)  TECHNIQUE: Multiplanar multisequence MR imaging of the abdomen was performed. Heavily T2-weighted images of the biliary and pancreatic ducts were obtained, and three-dimensional MRCP images  were rendered by post processing.  COMPARISON:  CT scan 05/18/2013.  FINDINGS: There are small bilateral pleural effusions and bibasilar atelectasis.  The liver demonstrates complex cysts in the right and left hepatic lobes. These are unchanged since the April CT scan. No intrahepatic biliary dilatation. The common bile duct is normal in caliber and has a normal course. The main pancreatic duct is normal. No common bile duct stones are identified. The maximum diameter of the common bowel duct and head of the pancreas is 9 mm. This is borderline enlarged for age. No ampullary mass or pancreatic head mass is identified. There may be a few tiny gallstones near the neck of the gallbladder. The gallbladder is tortuous and somewhat redundant.  Pancreas demonstrates mild diffuse inflammation consistent with acute pancreatitis. There is edema, fluid and inflammatory phlegmon surrounding the pancreas. The spleen is normal in size. No focal lesions. The adrenal glands and kidneys are unremarkable.  The aorta is normal in caliber. No mesenteric or retroperitoneal mass or adenopathy. The visualized bowel is unremarkable.  IMPRESSION: Acute pancreatitis.  Mildly distended tortuous and redundant gallbladder with possible tiny stones in the neck region.  No common bile duct stones. Borderline common bile duct dilatation at 9 mm.  The main pancreatic duct is normal. No pancreatic head mass or ampullary lesion.  Benign-appearing but complex hepatic cysts.   Electronically Signed   By: Loralie Champagne M.D.   On: 05/19/2013 11:28    Scheduled Meds: . ALPRAZolam  0.5 mg Oral BID  . aspirin  81 mg Oral Daily  . Chlorhexidine Gluconate Cloth  6 each Topical Q0600  . dicyclomine  10 mg Oral BID  . diltiazem  240 mg Oral Daily  . enoxaparin (LOVENOX) injection  40 mg Subcutaneous Q24H  . [START ON 05/22/2013] influenza vac split quadrivalent PF  0.5 mL Intramuscular Tomorrow-1000  . levothyroxine  25 mcg Oral QAC breakfast  .  metoprolol succinate  100 mg Oral Daily  . mupirocin ointment   Nasal BID  . pantoprazole (PROTONIX) IV  40 mg Intravenous Q24H  . raloxifene  60  mg Oral Daily  . white petrolatum       Continuous Infusions:    Principal Problem:   Pancreatitis, acute Active Problems:   HTN (hypertension)   Abdominal pain, epigastric, RUQ, LUQ   Unspecified hypothyroidism   HOCM (hypertrophic obstructive cardiomyopathy)   OSA (obstructive sleep apnea)   Transaminitis   Diastolic CHF   Hypokalemia    Time spent: 25 minutes.     Kathleene Bergemann  [facility] Pager [phone]. If 7PM-7AM, please contact night-coverage at [email or web address], password [facility] 05/21/2013, 10:45 AM  LOS: 3 days

## 2013-05-21 NOTE — Discharge Summary (Signed)
Patient ID: WILBURTA MILBOURN MRN: [id number] DOB/AGE: November 16, 1931 79 y.o.  Admit date: 05/18/2013 Discharge date: 05/21/2013  Procedures: lap chole with IOC  Consults: GI and general surgery  Reason for Admission: Elizabeth Shields is a 79 y.o. female who presented to the [facility] ED with complaints of ABD pain, Nausea and vomiting and diarrhea for the past 4 days. She describes having Diffuse ABD pain. She reports the symptoms began after she ate a chicken fillet sandwich from [employer]. She denies having any fevers or chills. She reports feeling better when she vomits, and she has not been able to hold down any food or liquids over the past 2 days. In the ED she was evaluated and was found to have elevated transaminases and underwent and CT scan of the ABD and Pelvis which found inflammatory changes around the Pancreas, CBD Duodenum, and hepatic Flexure of the Gallbladder and Colon and also Dilatation of the CBD. A Bedside ABD [place] study was also performed at the [facility] ED. She was transferred to [facility] for admission and a GI consultation.    Admission Diagnoses:  1. Gallstone  Pancreatitis  2. ABD Pain - Due to #1.  3. Transaminitis 4. Hypokalemia- 5. HTN- 6. Hypothyroid- 7. HOCM-  8. OSA-  9. Diastolic CHF  10. Hyperlipidemia-  Hospital Course: the patient was admitted and GI was consulted for possible retained CBD stone.  She was also kept NPO due to her pancreatitis.  She got an MRCP which was negative for a retained stone.  Once her pancreatitis improved, she was stable for for surgical intervention.  She underwent a lap chole with IOC on 05-20-13.  She tolerated this procedure well and was stable on POD1 for dc home.  Her pain was well controlled and she tolerated a regular diet.  PE: Abd: soft, minimally tender, +BS, ND, incisions c/d/i  Discharge Diagnoses:  Principal Problem:   Pancreatitis, acute Active Problems:   HTN (hypertension)   Abdominal pain,  epigastric, RUQ, LUQ   Unspecified hypothyroidism   HOCM (hypertrophic obstructive cardiomyopathy)   OSA (obstructive sleep apnea)   Transaminitis   Diastolic CHF   Hypokalemia s/p lap chole  Discharge Medications:   Medication List         acetaminophen 500 MG tablet  Commonly known as:  TYLENOL  Take 500 mg by mouth every 6 (six) hours as needed for pain.     ALPRAZolam 0.5 MG tablet  Commonly known as:  XANAX  0.5 mg 2 (two) times daily.     aspirin 81 MG tablet  Take 81 mg by mouth daily.     dicyclomine 10 MG capsule  Commonly known as:  BENTYL  Take 10 mg by mouth 2 (two) times daily.     diltiazem 240 MG 24 hr capsule  Commonly known as:  DILACOR XR  Take 240 mg by mouth daily.     levothyroxine 25 MCG tablet  Commonly known as:  SYNTHROID, LEVOTHROID  Take 25 mcg by mouth daily before breakfast.     metoprolol succinate 100 MG 24 hr tablet  Commonly known as:  TOPROL-XL  Take 100 mg by mouth daily. Take with or immediately following a meal.     ONE-A-DAY WOMENS FORMULA PO  Take 1 tablet by mouth daily.     oxyCODONE 5 MG immediate release tablet  Commonly known as:  Oxy IR/ROXICODONE  Take 1-2 tablets (5-10 mg total) by mouth every 4 (four) hours as needed.  pantoprazole 40 MG tablet  Commonly known as:  PROTONIX  Take 40 mg by mouth daily.     potassium chloride 10 MEQ tablet  Commonly known as:  K-DUR  Take 2 tablets (20 mEq total) by mouth daily.     pravastatin 10 MG tablet  Commonly known as:  PRAVACHOL  Take 10 mg by mouth daily.     raloxifene 60 MG tablet  Commonly known as:  EVISTA  Take 60 mg by mouth daily.        Discharge Instructions:     Follow-up Information   Follow up with [facility] On 06/11/2013. (3:45pm, arrive by 3:15pm for paperwork)    Contact information:   [address]   [place] [address] [phone]       Follow up with primary doctor. (As needed)        Signed: Yandiel Bergum E 05/21/2013, 9:30 AM

## 2013-05-21 NOTE — Discharge Summary (Signed)
Patient interviewed and examined, agree with PA note above.  Mariella Saa MD, FACS  05/21/2013 9:46 AM

## 2013-05-21 NOTE — Progress Notes (Signed)
DC home with daughter, verbally understood DC instructions, no questions asked 

## 2013-06-11 ENCOUNTER — Encounter (INDEPENDENT_AMBULATORY_CARE_PROVIDER_SITE_OTHER): Payer: Self-pay | Admitting: General Surgery

## 2013-06-11 ENCOUNTER — Ambulatory Visit (INDEPENDENT_AMBULATORY_CARE_PROVIDER_SITE_OTHER): Payer: Medicare Other | Admitting: General Surgery

## 2013-06-11 VITALS — BP 128/72 | HR 85 | Temp 98.0°F | Resp 18 | Ht 64.0 in | Wt 154.0 lb

## 2013-06-11 DIAGNOSIS — K810 Acute cholecystitis: Secondary | ICD-10-CM

## 2013-06-11 NOTE — Progress Notes (Signed)
[facility] A [place] 11-20-1931 [id number] 06/11/2013   Elizabeth Shields is a 79 y.o. female who had a laparoscopic cholecystectomy with intraoperative cholangiogram by Dr. Mariella Saa, MD .  The pathology report confirmed Gallbladder - MILD CHRONIC CHOLECYSTITIS. - CHOLELITHIASIS PRESENT. - NO TUMOR SEEN..   The patient reports that they are feeling well with normal bowel movements and good appetite.  The pre-operative symptoms of abdominal pain, nausea, and vomiting have resolved.   Gall stone pancreatitis presenting diagnosis Physical examination: BP 128/72  Pulse 85  Temp(Src) 98 F (36.7 C)  Resp 18  Ht 5\' 4"  (1.626 m)  Wt 69.854 kg (154 lb)  BMI 26.42 kg/m2  - Incisions appear well-healed with no sign of infection or bleeding.   Abdomen - soft, non-tender  Impression:  s/p laparoscopic cholecystectomy  Plan:  She may resume a regular diet and full activity.  She may follow-up on a PRN basis.

## 2013-06-11 NOTE — Patient Instructions (Signed)
Call if you have a problem. 

## 2014-03-23 IMAGING — CT CT ABD-PELV W/ CM
2 of 5 series · 16 of 46 positions shown, 18 images · IV contrast (omnipaque)
Comparison: 11/05/2012

CLINICAL DATA: Abdominal pain

EXAM:
CT ABDOMEN AND PELVIS WITH CONTRAST
TECHNIQUE: Multidetector CT imaging of the abdomen and pelvis was performed
using the standard protocol following bolus administration of
intravenous contrast.
CONTRAST:  50mL OMNIPAQUE IOHEXOL 300 MG/ML SOLN, 100mL OMNIPAQUE
IOHEXOL 300 MG/ML SOLN

[Series 3: abd/pelvis 5.0 b31f · axial · 0.77mm/px · z∈[-406,-0]mm · 13 of 91 slices shown, 15 images]
[im 5/91  soft-tissue]
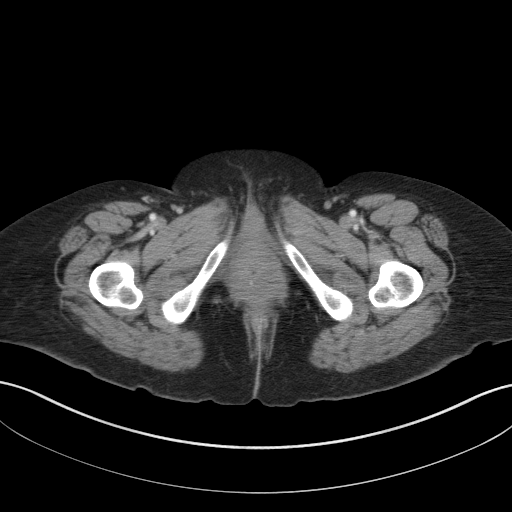
[im 5/91  bone]
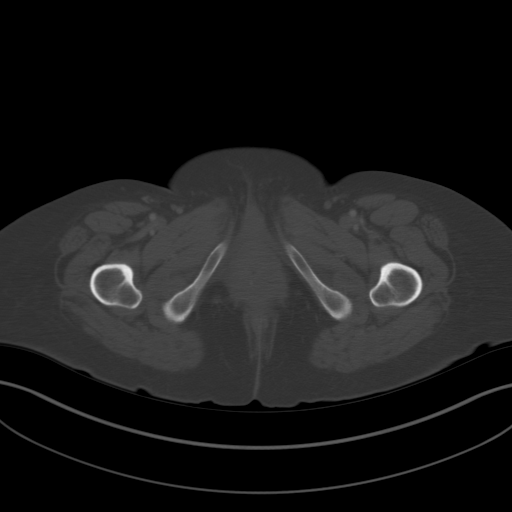
[im 15/91  soft-tissue]
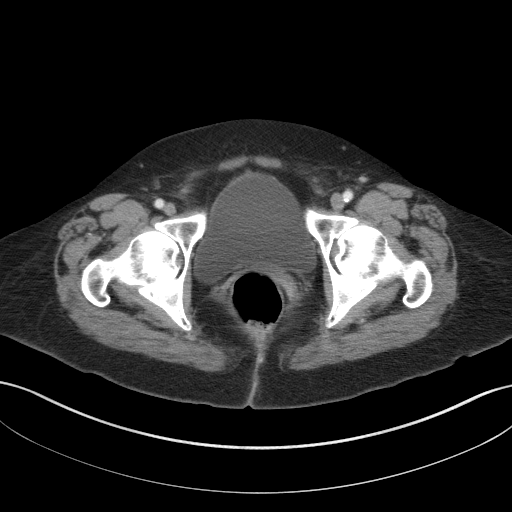
[im 19/91  soft-tissue]
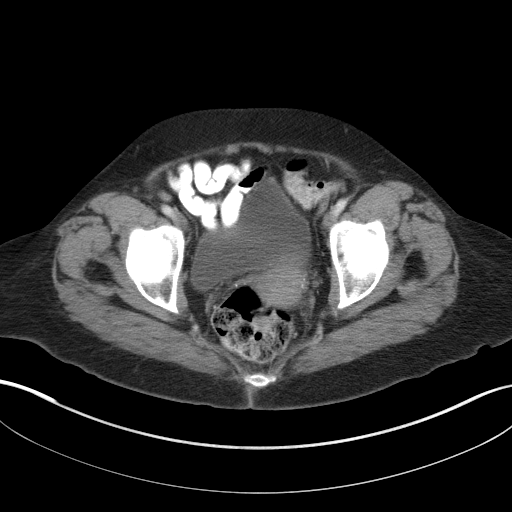
[im 24/91  soft-tissue]
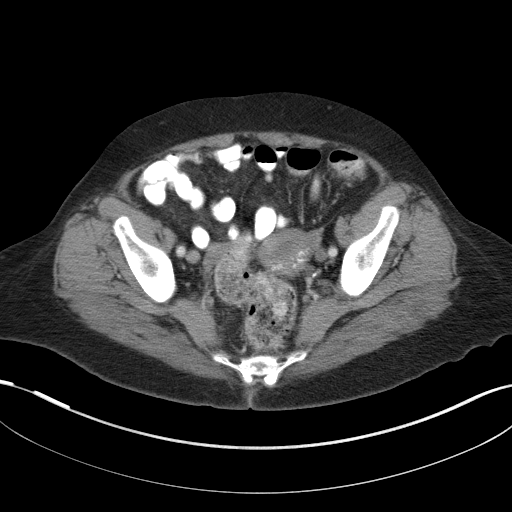
[im 34/91  soft-tissue]
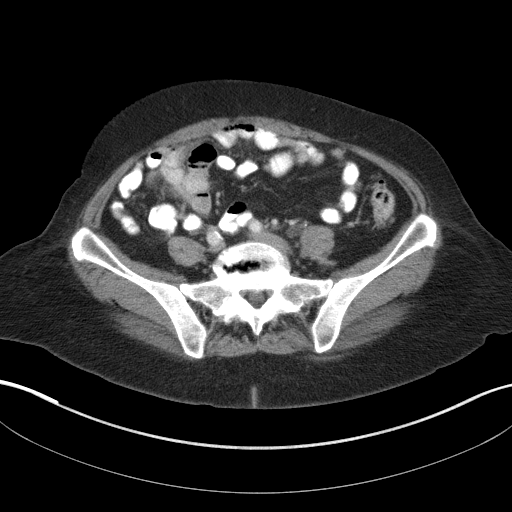
[im 38/91  soft-tissue]
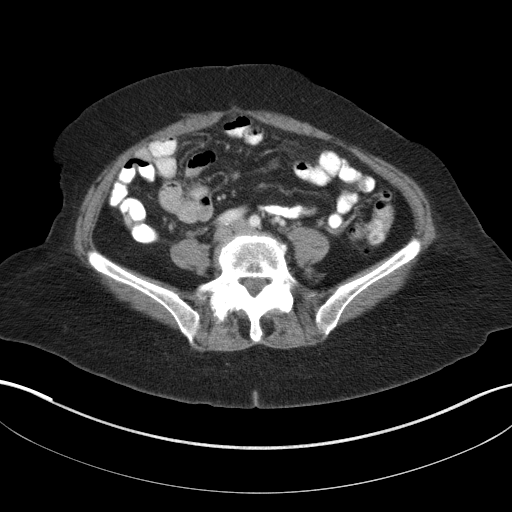
[im 48/91  soft-tissue]
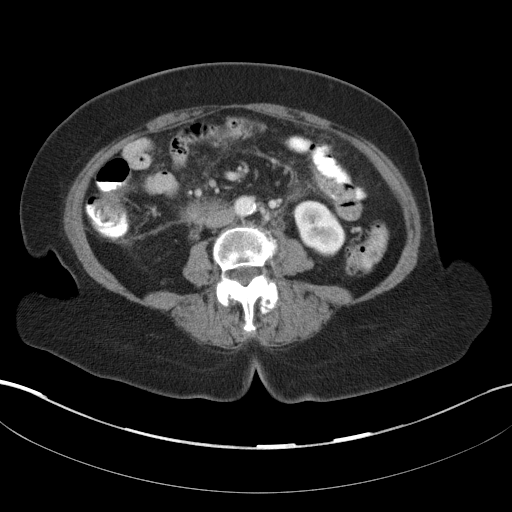
[im 53/91  soft-tissue]
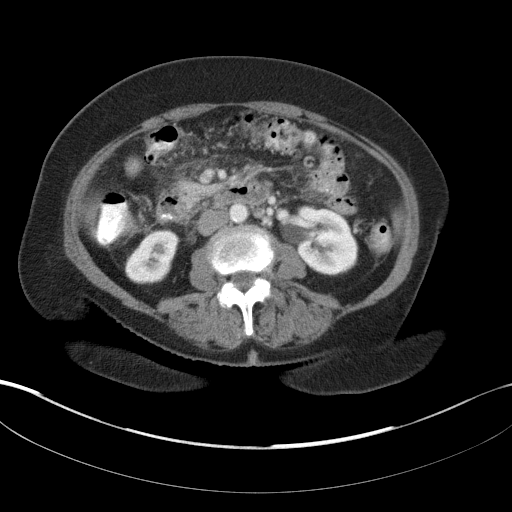
[im 57/91  soft-tissue]
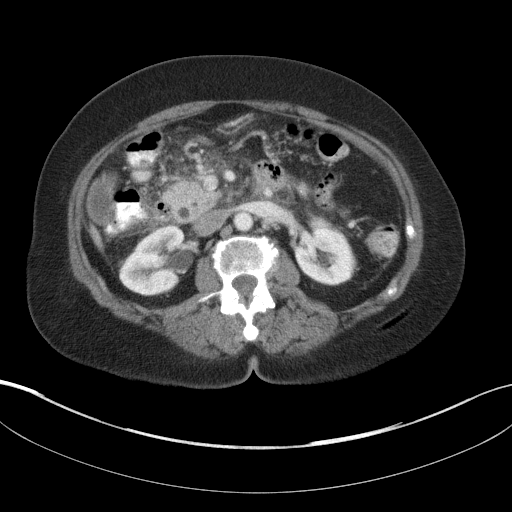
[im 57/91  bone]
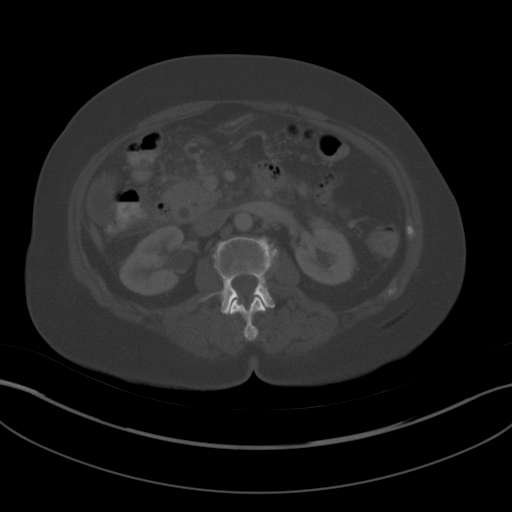
[im 67/91  soft-tissue]
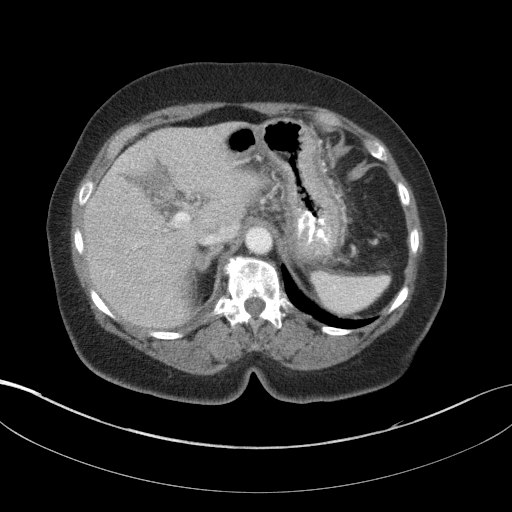
[im 72/91  soft-tissue]
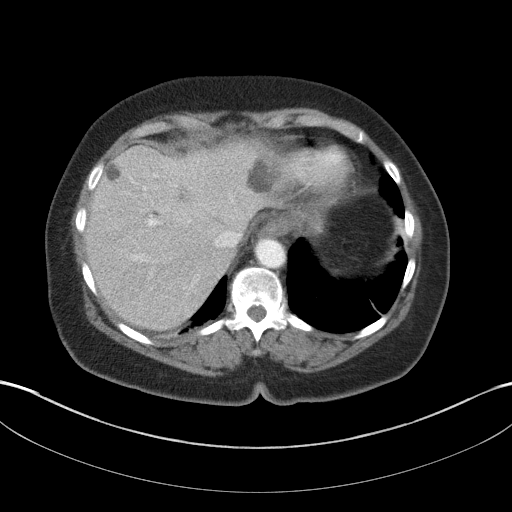
[im 76/91  soft-tissue]
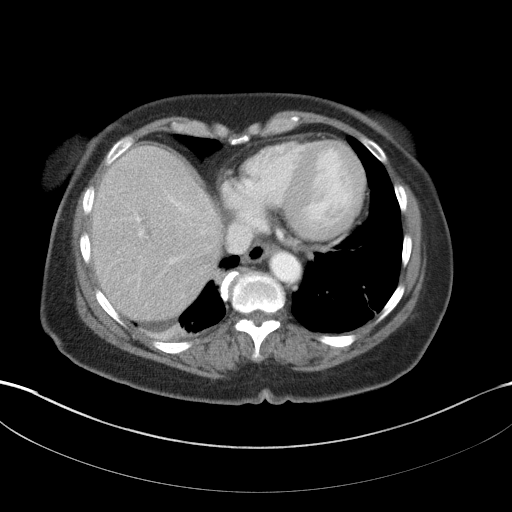
[im 86/91  soft-tissue]
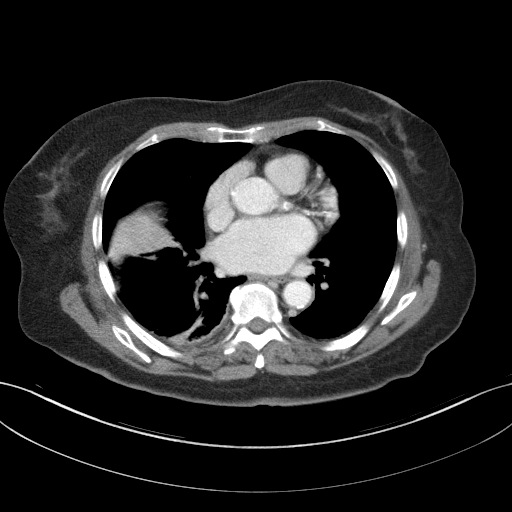

[Series 6: abd/pelvis 3.0 coronal · coronal · 1.00mm/px · 3 of 72 slices shown]
[im 24/72  soft-tissue]
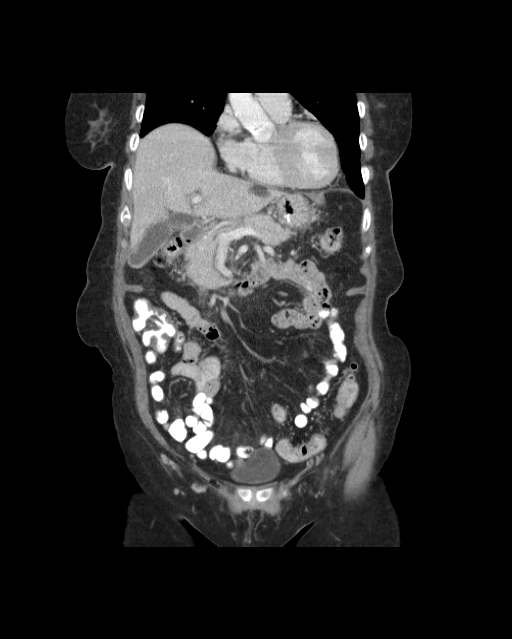
[im 32/72  soft-tissue]
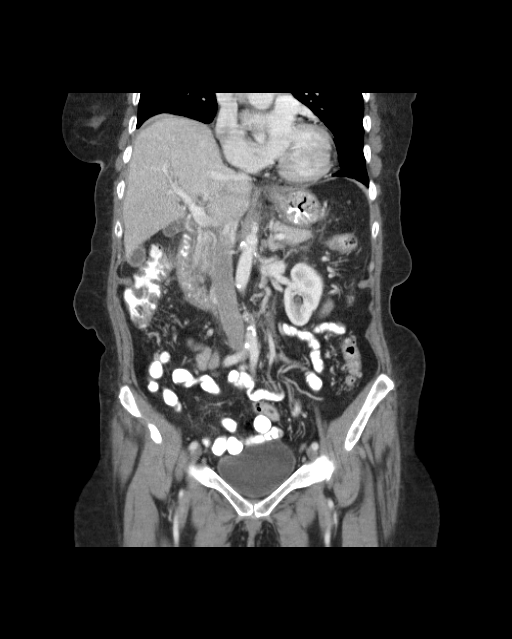
[im 40/72  soft-tissue]
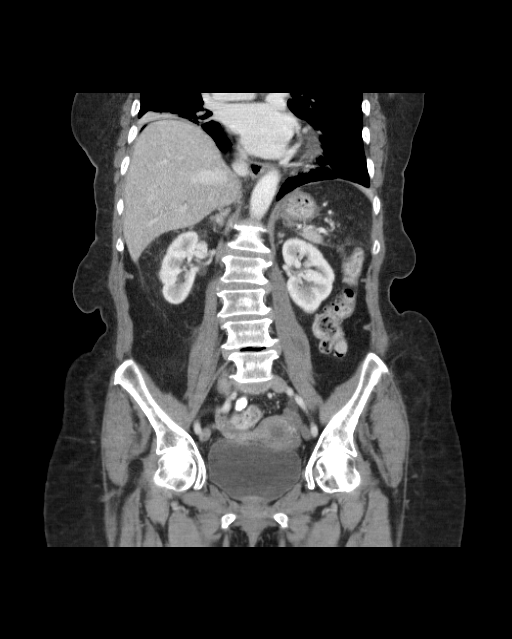

[16 of 46 positions shown; findings below may reference images not displayed]

FINDINGS: Subsegmental atelectasis is present at both lung bases right greater
than left. Tiny right pleural effusion.

Stable coronary artery calcifications.

There is stranding within the intraperitoneal fat of the right upper
quadrant. The inflammatory process also involves the duodenum,
pancreatic head, gallbladder, and hepatic flexure of the colon. No
pneumatosis or extraluminal bowel gas. There is prominence of the
wall of the gallbladder. There is also inflammatory change of the
wall of the colon and duodenum. Fascia planes in the pancreatic head
are blurred.

Lobulated benign appearing cysts in the liver are stable. Small
amount of fluid density surrounds the spleen. Inflammatory changes
within the lesser sac are also noted.

Stranding is seen along the right lower quadrant retroperitoneum
extending from the right upper quadrant.

No evidence of pancreatic hemorrhage or necrosis.

Mild inflammatory change of the adrenal glands.

Stable appearance of the kidneys.

The common bile duct remains mildly dilated at 9.5 mm.

The endometrial stripe is prominent measuring 16 mm in diameter.

Multilevel degenerative disc disease in the lumbar spine. Vacuum is
present at L4-5 and L5-S1. Bulging discs at L2-3, L3-4, and L5-S1.

No acute bony deformity.
IMPRESSION: Inflammatory process in the right upper quadrant is present as
described affecting the pancreatic head, duodenum, hepatic flexure
of the colon, and gallbladder.

Common bile duct is dilated. Correlate with liver function tests as
for the possibility of biliary obstruction.

Degenerative changes in the lumbar spine.

Small right pleural effusion and basilar atelectasis.

## 2018-03-30 ENCOUNTER — Emergency Department (HOSPITAL_BASED_OUTPATIENT_CLINIC_OR_DEPARTMENT_OTHER): Payer: Medicare Other

## 2018-03-30 ENCOUNTER — Emergency Department (HOSPITAL_BASED_OUTPATIENT_CLINIC_OR_DEPARTMENT_OTHER)
Admission: EM | Admit: 2018-03-30 | Discharge: 2018-03-30 | Disposition: A | Discharge status: Home / Self Care | Payer: Medicare Other | Attending: Emergency Medicine | Admitting: Emergency Medicine

## 2018-03-30 ENCOUNTER — Encounter (HOSPITAL_BASED_OUTPATIENT_CLINIC_OR_DEPARTMENT_OTHER): Payer: Self-pay

## 2018-03-30 ENCOUNTER — Other Ambulatory Visit: Payer: Self-pay

## 2018-03-30 DIAGNOSIS — Z79899 Other long term (current) drug therapy: Secondary | ICD-10-CM | POA: Insufficient documentation

## 2018-03-30 DIAGNOSIS — R0981 Nasal congestion: Secondary | ICD-10-CM

## 2018-03-30 DIAGNOSIS — I110 Hypertensive heart disease with heart failure: Secondary | ICD-10-CM | POA: Insufficient documentation

## 2018-03-30 DIAGNOSIS — E039 Hypothyroidism, unspecified: Secondary | ICD-10-CM | POA: Insufficient documentation

## 2018-03-30 DIAGNOSIS — R05 Cough: Secondary | ICD-10-CM | POA: Diagnosis present

## 2018-03-30 DIAGNOSIS — I5032 Chronic diastolic (congestive) heart failure: Secondary | ICD-10-CM | POA: Insufficient documentation

## 2018-03-30 DIAGNOSIS — R059 Cough, unspecified: Secondary | ICD-10-CM

## 2018-03-30 DIAGNOSIS — J181 Lobar pneumonia, unspecified organism: Secondary | ICD-10-CM

## 2018-03-30 DIAGNOSIS — Z7982 Long term (current) use of aspirin: Secondary | ICD-10-CM | POA: Insufficient documentation

## 2018-03-30 DIAGNOSIS — J189 Pneumonia, unspecified organism: Secondary | ICD-10-CM | POA: Diagnosis not present

## 2018-03-30 MED ORDER — FLUTICASONE PROPIONATE 50 MCG/ACT NA SUSP: 1.0000 | Freq: Every day | NASAL | 0 refills | Status: DC

## 2018-03-30 MED ORDER — ONDANSETRON 4 MG PO TBDP: 4.0000 mg | ORAL_TABLET | Freq: Three times a day (TID) | ORAL | 0 refills | Status: DC | PRN

## 2018-03-30 MED ORDER — DOXYCYCLINE HYCLATE 100 MG PO CAPS: 100.0000 mg | ORAL_CAPSULE | Freq: Two times a day (BID) | ORAL | 0 refills | Status: DC

## 2018-03-30 NOTE — ED Provider Notes (Signed)
[facility] EMERGENCY DEPARTMENT Provider Note   CSN: [id number] Arrival date & time: 03/30/18  1500     History   Chief Complaint Chief Complaint  Patient presents with  . Cough    HPI Elizabeth Shields is a 84 y.o. female with a history of diastolic CHF, GERD, hypertrophic obstructive cardiomyopathy, hypertension, hypothyroidism, and sleep apnea who presents to the emergency department with complaints of URI symptoms over the past 5 days.  Patient reports she is having congestion, rhinorrhea, intermittent ear pain, scratchy throat, and productive cough with yellow mucus sputum.  She states that sometimes she feels she is having trouble coughing the mucus up and gets a bit nauseous when trying to do so.  Patient states symptoms are somewhat intermittent, however they will not seem to go away.  She had some improvement with Tussionex and will leads, no other specific alleviating or aggravating factors.  She does report history of sick contacts with her granddaughters who have had similar symptoms over the past 2 weeks.  Denies fever, chills, shortness of breath, chest pain, lower extremity swelling, vomiting, or recent tick exposures.  HPI  Past Medical History:  Diagnosis Date  . Anxiety   . Arthritis   . Borderline diabetes   . Diastolic CHF (HCC) 05/19/2013  . GERD (gastroesophageal reflux disease)   . HOCM (hypertrophic obstructive cardiomyopathy) (HCC)    a. Dx 2011 by Dr. Launa [facility];  b. 10/2012 Echo: EF 65-70% w/ dynamic obstruction @ rest in the outflow tract w/ peak velocity of 500 cm/sec & peak gradient of , nl wall motion, Gr 1 DD, sev SAM of the ant leaflet, mild to mod MR,pasp .   Marland Kitchen Hypertension   . Hypothyroidism   . Sleep apnea   . Thyroid disease     Patient Active Problem List   Diagnosis Date Noted  . Transaminitis 05/19/2013  . Diastolic CHF (HCC) 05/19/2013  . Colitis 05/19/2013  . Hypokalemia  05/19/2013  . Acute on chronic diastolic heart failure (HCC) 11/09/2012  . OSA (obstructive sleep apnea) 11/08/2012  . Acute diastolic heart failure (HCC) 11/08/2012  . HOCM (hypertrophic obstructive cardiomyopathy) (HCC)   . Unspecified hypothyroidism 11/06/2012  . Pancreatitis, acute 11/05/2012  . HTN (hypertension) 11/05/2012  . Abdominal pain, epigastric, RUQ, LUQ 11/05/2012  . Nonspecific changes on EKG 11/05/2012    Past Surgical History:  Procedure Laterality Date  . CHOLECYSTECTOMY N/A 05/20/2013   Procedure: LAPAROSCOPIC CHOLECYSTECTOMY WITH INTRAOPERATIVE CHOLANGIOGRAM;  Surgeon: Mariella Saa, MD;  Location: [facility] OR;  Service: General;  Laterality: N/A;  . TUBAL LIGATION       OB History   None      Home Medications    Prior to Admission medications   Medication Sig Start Date End Date Taking? Authorizing Provider  acetaminophen (TYLENOL) 500 MG tablet Take 500 mg by mouth every 6 (six) hours as needed for pain.    [provider]  ALPRAZolam Prudy Feeler) 0.5 MG tablet 0.5 mg 2 (two) times daily.    [provider]  aspirin 81 MG tablet Take 81 mg by mouth daily.    [provider]  dicyclomine (BENTYL) 10 MG capsule Take 10 mg by mouth 2 (two) times daily.    [provider]  diltiazem (DILACOR XR) 240 MG 24 hr capsule Take 240 mg by mouth daily.    [provider]  levothyroxine (SYNTHROID, LEVOTHROID) 25 MCG tablet Take 25 mcg by  mouth daily before breakfast.    [provider]  metoprolol succinate (TOPROL-XL) 100 MG 24 hr tablet Take 100 mg by mouth daily. Take with or immediately following a meal.    [provider]  Multiple Vitamins-Calcium (ONE-A-DAY WOMENS FORMULA PO) Take 1 tablet by mouth daily.    [provider]  oxyCODONE (OXY IR/ROXICODONE) 5 MG immediate release tablet Take 1-2 tablets (5-10 mg total) by mouth every 4 (four) hours as needed. 05/21/13   Barnetta Chapelsborne, Kelly, PA-C    pantoprazole (PROTONIX) 40 MG tablet Take 40 mg by mouth daily.    [provider]  pravastatin (PRAVACHOL) 10 MG tablet Take 10 mg by mouth daily.    [provider]  raloxifene (EVISTA) 60 MG tablet Take 60 mg by mouth daily.    [provider]  TAZTIA XT 240 MG 24 hr capsule  06/04/13   [provider]    Family History Family History  Problem Relation Age of Onset  . Heart disease Unknown        mother, father, 4 siblings - pt unaware of details    Social History Social History   Tobacco Use  . Smoking status: Never Smoker  . Smokeless tobacco: Never Used  Substance Use Topics  . Alcohol use: No  . Drug use: No     Allergies   Sulfa antibiotics   Review of Systems Review of Systems  Constitutional: Negative for chills and fever.  HENT: Positive for congestion, ear pain and sore throat ("scratchy"). Negative for trouble swallowing and voice change.   Respiratory: Positive for cough. Negative for shortness of breath and wheezing.   Cardiovascular: Negative for chest pain, palpitations and leg swelling.  Gastrointestinal: Positive for nausea. Negative for abdominal pain and vomiting.  All other systems reviewed and are negative.  Physical Exam Updated Vital Signs BP (!) 172/90 (BP Location: Left Arm)   Pulse (!) 59   Temp 98.2 F (36.8 C) (Oral)   Resp 18   Ht 5\' 3"  (1.6 m)   Wt 73.3 kg   SpO2 96%   BMI 28.64 kg/m   Physical Exam  Constitutional: She appears well-developed and well-nourished. No distress.  HENT:  Head: Normocephalic and atraumatic.  Right Ear: Tympanic membrane is not perforated, not erythematous, not retracted and not bulging.  Left Ear: Tympanic membrane is not perforated, not erythematous, not retracted and not bulging.  Nose: Mucosal edema present.  Mouth/Throat: Uvula is midline and oropharynx is clear and moist. No oropharyngeal exudate or posterior oropharyngeal erythema.  Patient is tolerating  his own secretions without difficulty. No trismus. No drooling. No hot potato voice.   Eyes: Pupils are equal, round, and reactive to light. Conjunctivae are normal. Right eye exhibits no discharge. Left eye exhibits no discharge.  Neck: Normal range of motion. Neck supple.  Cardiovascular: Normal rate and regular rhythm.  No murmur heard. Pulmonary/Chest: Effort normal. No respiratory distress. She has decreased breath sounds (L sided). She has no wheezes. She has no rhonchi. She has no rales.  Abdominal: Soft. She exhibits no distension. There is no tenderness.  Musculoskeletal: She exhibits no edema or tenderness.  Lymphadenopathy:    She has no cervical adenopathy.  Neurological: She is alert.  Skin: Skin is warm and dry. No rash noted.  Psychiatric: She has a normal mood and affect. Her behavior is normal.  Nursing note and vitals reviewed.   ED Treatments / Results  Labs (all labs ordered are listed, but  only abnormal results are displayed) Labs Reviewed - No data to display  EKG None  Radiology Dg Chest 2 View  Result Date: 03/30/2018 CLINICAL DATA:  Cough EXAM: CHEST - 2 VIEW COMPARISON:  11/08/2012 FINDINGS: Interval clearing of bibasilar airspace disease and bilateral pleural effusions. Negative for heart failure. Small patchy airspace density in the left upper lobe, not definitely seen previously. Right lung clear. Degenerative changes in both shoulders. No acute skeletal abnormality IMPRESSION: Approximately 15 mm density left upper lobe of uncertain etiology. Possible pneumonia. Followup PA and lateral chest X-ray is recommended in 3-4 weeks following trial of antibiotic therapy to ensure resolution and exclude underlying malignancy. Electronically Signed   By: Marlan Palau M.D.   On: 03/30/2018 16:45    Procedures Procedures (including critical care time)  Medications Ordered in ED Medications - No data to display   Initial Impression / Assessment and Plan / ED  Course  I have reviewed the triage vital signs and the nursing notes.  Pertinent labs & imaging results that were available during my care of the patient were reviewed by me and considered in my medical decision making (see chart for details).  Patient presents to the emergency department with complaints of URI symptoms.  Nontoxic-appearing, no apparent distress, vitals notable for elevated blood pressure, doubt HTN emergency, patient aware of need for recheck, improved with repeat vitals. Overall reassuring exam, she does have nasal congestion/edema and mild decreased breath sounds to L side, no specific adventitious sounds auscultated.  Further evaluate with chest x-ray.  Patient's checks x-ray notable for approximately 15 mm density in the left upper lobe of uncertain etiology, radiology queries pneumonia, recommendations for follow-up PA and lateral chest x-ray in 3 to 4 weeks following trial of antibiotics to ensure resolution and exclude underlying malignancy.  Patient does not appear to be in any type of respiratory distress.  SPO2 remains greater than 95%.  She is not tachypneic.  She is not febrile.  She appears to be resting comfortably. We will trial outpatient abx- discussed with supervising physician Dr. Charm Barges, who personally evaluated/examined patient will place patient on Doxycycline and Flonase and have her follow up closely with PCP with strict return precautions. I discussed results, treatment plan, need for PCP follow-up, and return precautions with the patient and family at bedside. Provided opportunity for questions, patient and family confirmed understanding and are in agreement with plan.   Vitals:   03/30/18 1506 03/30/18 1738  BP: (!) 172/90 (!) 149/78  Pulse: (!) 59 68  Resp: 18 20  Temp: 98.2 F (36.8 C)   SpO2: 96% 95%      Clinical Course as of Mar 30 1722  Fri Mar 30, 2018  [id number] 84 year old female with postnasal drip some nausea and a cough.  She is very  nontoxic-appearing.  Sats 96% appears very comfortable.  Her chest x-ray, spent a 15 mm nodular density in the left upper lobe that is possibly infectious.  We need to review this with her and likely start her on some antibiotics and have close follow-up with her PCP.   [MB]    Clinical Course User Index [MB] Terrilee Files, MD    Final Clinical Impressions(s) / ED Diagnoses   Final diagnoses:  Cough  Nasal congestion  Pneumonia of left upper lobe due to infectious organism [facility])    ED Discharge Orders         Ordered    doxycycline (VIBRAMYCIN) 100 MG capsule  2 times  daily     03/30/18 1731    fluticasone (FLONASE) 50 MCG/ACT nasal spray  Daily     03/30/18 1731    ondansetron (ZOFRAN ODT) 4 MG disintegrating tablet  Every 8 hours PRN     03/30/18 1745           Nyjah Denio, Pleas Koch, PA-C 03/30/18 1747    Terrilee Files, MD 03/31/18 1007

## 2018-03-30 NOTE — ED Triage Notes (Signed)
Pt c/o cough with nasal congestion and drainage since Monday, no meds prior to arrival for symptoms, no fever

## 2018-03-30 NOTE — Discharge Instructions (Signed)
You were seen in the emergency department today for cough and congestion.  Your chest x-ray shows a small density in the left upper portion of your lung that could represent pneumonia.  We are placing you on antibiotics, doxycycline, to help treat this infection. Please take all of your antibiotics until finished. You may develop abdominal discomfort or diarrhea from the antibiotic.  You may help offset this with probiotics which you can buy at the store (ask your pharmacist if unable to find) or get probiotics in the form of eating yogurt. Do not eat or take the probiotics until 2 hours after your antibiotic. If you are unable to tolerate these side effects follow-up with your primary care provider or return to the emergency department.   If you begin to experience any blistering, rashes, swelling, or difficulty breathing seek medical care for evaluation of potentially more serious side effects.   Please be aware that this medication may interact with other medications you are taking, please be sure to discuss your medication list with your pharmacist. If you are taking birth control the antibiotic will deactivate your birth control for 2 weeks. If on coumadin the antibiotic will effect your coumadin level.   We have also given you a prescription for Flonase, this is a nasal spray to help with congestion, please use this once per day in each nostril.  Would like you to follow-up closely with your primary care provider within 3-4 days for reevaluation of your symptoms.  Additionally you will need to have a repeat chest x-ray performed in 3 to 4 weeks to ensure that the density seen on your chest x-ray has completely gone away and that this was truly pneumonia.  If the abnormality on your chest x-ray has not resolved this may need further evaluation which can be coordinated by your primary care provider.   Return to the ER anytime for new or worsening symptoms including but not limited to trouble breathing,  chest pain, fever, worsening of symptoms, or any other concerns. Given we are not 100% sure that this is pneumonia we would like you to follow-up

## 2019-02-02 IMAGING — CR DG CHEST 2V
2 series · 2 of 2 positions shown · non-contrast
Comparison: 11/08/2012

CLINICAL DATA: Cough

EXAM:
CHEST - 2 VIEW

[w chest pa]
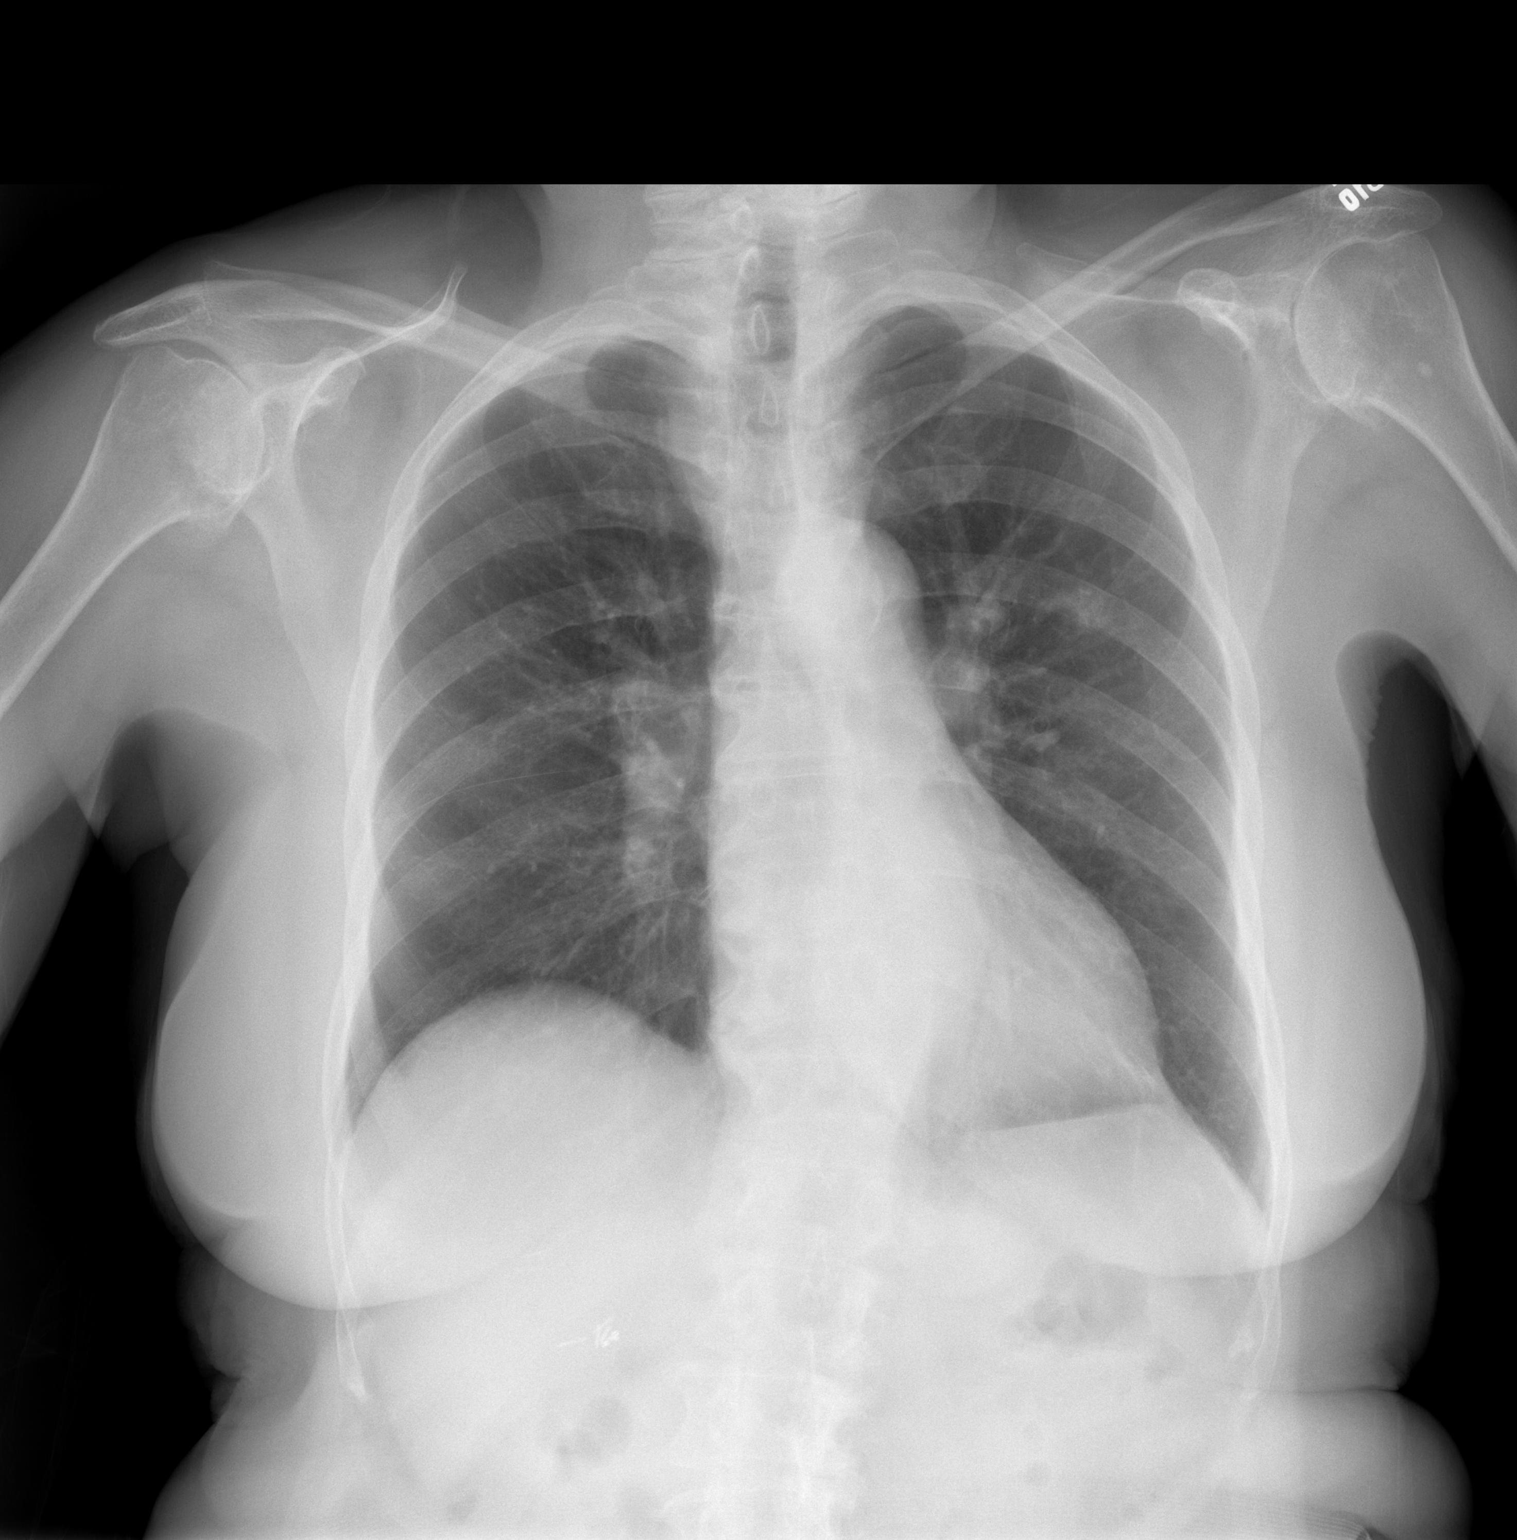

[w chest lat]
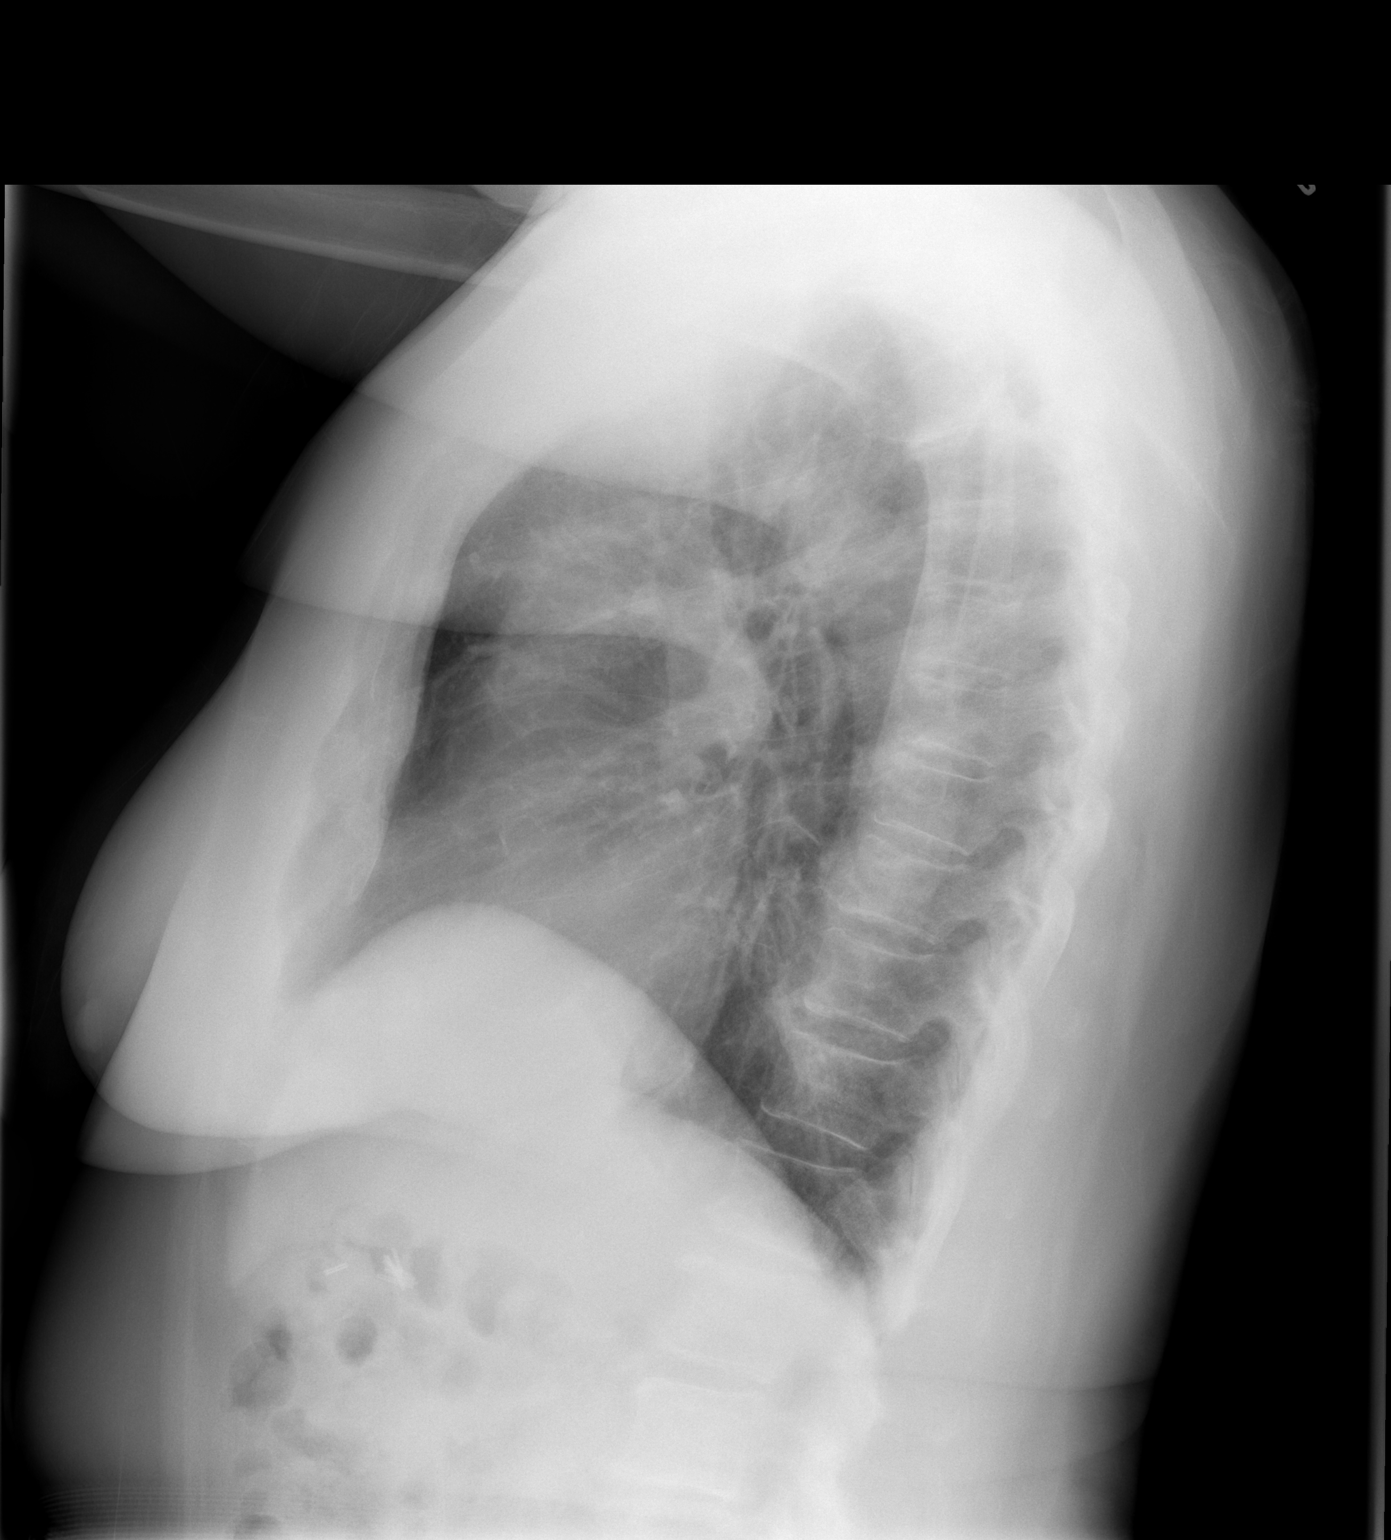

[2 of 2 positions shown; findings below may reference images not displayed]

FINDINGS: Interval clearing of bibasilar airspace disease and bilateral
pleural effusions. Negative for heart failure.

Small patchy airspace density in the left upper lobe, not definitely
seen previously. Right lung clear.

Degenerative changes in both shoulders. No acute skeletal
abnormality
IMPRESSION: Approximately 15 mm density left upper lobe of uncertain etiology.
Possible pneumonia. Followup PA and lateral chest X-ray is
recommended in 3-4 weeks following trial of antibiotic therapy to
ensure resolution and exclude underlying malignancy.

## 2022-10-27 ENCOUNTER — Emergency Department (HOSPITAL_BASED_OUTPATIENT_CLINIC_OR_DEPARTMENT_OTHER): Payer: Medicare Other

## 2022-10-27 ENCOUNTER — Encounter (HOSPITAL_BASED_OUTPATIENT_CLINIC_OR_DEPARTMENT_OTHER): Payer: Self-pay | Admitting: Emergency Medicine

## 2022-10-27 ENCOUNTER — Other Ambulatory Visit: Payer: Self-pay

## 2022-10-27 ENCOUNTER — Inpatient Hospital Stay (HOSPITAL_BASED_OUTPATIENT_CLINIC_OR_DEPARTMENT_OTHER)
Admission: EM | Admit: 2022-10-27 | Discharge: 2022-10-31 | DRG: 329 | Disposition: E | Payer: Medicare Other | Attending: Emergency Medicine | Admitting: Emergency Medicine

## 2022-10-27 DIAGNOSIS — Z79899 Other long term (current) drug therapy: Secondary | ICD-10-CM

## 2022-10-27 DIAGNOSIS — G4733 Obstructive sleep apnea (adult) (pediatric): Secondary | ICD-10-CM | POA: Diagnosis present

## 2022-10-27 DIAGNOSIS — I480 Paroxysmal atrial fibrillation: Secondary | ICD-10-CM | POA: Diagnosis present

## 2022-10-27 DIAGNOSIS — A419 Sepsis, unspecified organism: Secondary | ICD-10-CM | POA: Diagnosis not present

## 2022-10-27 DIAGNOSIS — N179 Acute kidney failure, unspecified: Secondary | ICD-10-CM | POA: Diagnosis not present

## 2022-10-27 DIAGNOSIS — Z7982 Long term (current) use of aspirin: Secondary | ICD-10-CM

## 2022-10-27 DIAGNOSIS — R1013 Epigastric pain: Secondary | ICD-10-CM | POA: Diagnosis present

## 2022-10-27 DIAGNOSIS — I110 Hypertensive heart disease with heart failure: Secondary | ICD-10-CM | POA: Diagnosis present

## 2022-10-27 DIAGNOSIS — I10 Essential (primary) hypertension: Secondary | ICD-10-CM | POA: Diagnosis present

## 2022-10-27 DIAGNOSIS — M1990 Unspecified osteoarthritis, unspecified site: Secondary | ICD-10-CM | POA: Diagnosis present

## 2022-10-27 DIAGNOSIS — I4891 Unspecified atrial fibrillation: Secondary | ICD-10-CM | POA: Diagnosis not present

## 2022-10-27 DIAGNOSIS — E874 Mixed disorder of acid-base balance: Secondary | ICD-10-CM | POA: Diagnosis not present

## 2022-10-27 DIAGNOSIS — Z66 Do not resuscitate: Secondary | ICD-10-CM | POA: Diagnosis not present

## 2022-10-27 DIAGNOSIS — Z882 Allergy status to sulfonamides status: Secondary | ICD-10-CM

## 2022-10-27 DIAGNOSIS — E785 Hyperlipidemia, unspecified: Secondary | ICD-10-CM | POA: Diagnosis present

## 2022-10-27 DIAGNOSIS — K55059 Acute (reversible) ischemia of intestine, part and extent unspecified: Secondary | ICD-10-CM | POA: Diagnosis not present

## 2022-10-27 DIAGNOSIS — I5030 Unspecified diastolic (congestive) heart failure: Secondary | ICD-10-CM | POA: Diagnosis present

## 2022-10-27 DIAGNOSIS — J9601 Acute respiratory failure with hypoxia: Secondary | ICD-10-CM | POA: Diagnosis not present

## 2022-10-27 DIAGNOSIS — K559 Vascular disorder of intestine, unspecified: Secondary | ICD-10-CM | POA: Diagnosis not present

## 2022-10-27 DIAGNOSIS — N170 Acute kidney failure with tubular necrosis: Secondary | ICD-10-CM | POA: Diagnosis not present

## 2022-10-27 DIAGNOSIS — E039 Hypothyroidism, unspecified: Secondary | ICD-10-CM | POA: Diagnosis present

## 2022-10-27 DIAGNOSIS — R571 Hypovolemic shock: Secondary | ICD-10-CM | POA: Diagnosis not present

## 2022-10-27 DIAGNOSIS — R6521 Severe sepsis with septic shock: Secondary | ICD-10-CM | POA: Diagnosis not present

## 2022-10-27 DIAGNOSIS — K56609 Unspecified intestinal obstruction, unspecified as to partial versus complete obstruction: Secondary | ICD-10-CM | POA: Diagnosis present

## 2022-10-27 DIAGNOSIS — I509 Heart failure, unspecified: Secondary | ICD-10-CM | POA: Diagnosis not present

## 2022-10-27 DIAGNOSIS — F411 Generalized anxiety disorder: Secondary | ICD-10-CM | POA: Diagnosis present

## 2022-10-27 DIAGNOSIS — N281 Cyst of kidney, acquired: Secondary | ICD-10-CM | POA: Diagnosis present

## 2022-10-27 DIAGNOSIS — Z79890 Hormone replacement therapy (postmenopausal): Secondary | ICD-10-CM | POA: Diagnosis not present

## 2022-10-27 DIAGNOSIS — K5651 Intestinal adhesions [bands], with partial obstruction: Principal | ICD-10-CM | POA: Diagnosis present

## 2022-10-27 DIAGNOSIS — E871 Hypo-osmolality and hyponatremia: Secondary | ICD-10-CM | POA: Diagnosis present

## 2022-10-27 DIAGNOSIS — R739 Hyperglycemia, unspecified: Secondary | ICD-10-CM | POA: Diagnosis present

## 2022-10-27 DIAGNOSIS — I5032 Chronic diastolic (congestive) heart failure: Secondary | ICD-10-CM | POA: Diagnosis present

## 2022-10-27 DIAGNOSIS — I421 Obstructive hypertrophic cardiomyopathy: Secondary | ICD-10-CM | POA: Diagnosis present

## 2022-10-27 DIAGNOSIS — J9600 Acute respiratory failure, unspecified whether with hypoxia or hypercapnia: Secondary | ICD-10-CM | POA: Diagnosis not present

## 2022-10-27 DIAGNOSIS — T68XXXA Hypothermia, initial encounter: Secondary | ICD-10-CM | POA: Diagnosis not present

## 2022-10-27 DIAGNOSIS — R748 Abnormal levels of other serum enzymes: Secondary | ICD-10-CM | POA: Diagnosis present

## 2022-10-27 DIAGNOSIS — K219 Gastro-esophageal reflux disease without esophagitis: Secondary | ICD-10-CM | POA: Diagnosis present

## 2022-10-27 LAB — URINALYSIS, ROUTINE W REFLEX MICROSCOPIC
Bilirubin Urine: NEGATIVE
Glucose, UA: NEGATIVE mg/dL
Hgb urine dipstick: NEGATIVE
Ketones, ur: NEGATIVE mg/dL
Leukocytes,Ua: NEGATIVE
Nitrite: NEGATIVE
Specific Gravity, Urine: 1.02 (ref 1.005–1.030)
pH: 6.5 (ref 5.0–8.0)

## 2022-10-27 LAB — COMPREHENSIVE METABOLIC PANEL
ALT: 12 U/L (ref 0–44)
AST: 20 U/L (ref 15–41)
Albumin: 4.6 g/dL (ref 3.5–5.0)
Alkaline Phosphatase: 123 U/L (ref 38–126)
Anion gap: 10 (ref 5–15)
BUN: 19 mg/dL (ref 8–23)
CO2: 23 mmol/L (ref 22–32)
Calcium: 11 mg/dL — ABNORMAL HIGH (ref 8.9–10.3)
Chloride: 99 mmol/L (ref 98–111)
Creatinine, Ser: 0.91 mg/dL (ref 0.44–1.00)
GFR, Estimated: >60 mL/min (ref >60)
Glucose, Bld: 136 mg/dL — ABNORMAL HIGH (ref 70–99)
Potassium: 4.5 mmol/L (ref 3.5–5.1)
Sodium: 132 mmol/L — ABNORMAL LOW (ref 135–145)
Total Bilirubin: 0.6 mg/dL (ref 0.3–1.2)
Total Protein: 8.7 g/dL — ABNORMAL HIGH (ref 6.5–8.1)

## 2022-10-27 LAB — CBC
HCT: 42.4 % (ref 36.0–46.0)
Hemoglobin: 14 g/dL (ref 12.0–15.0)
MCH: 27 pg (ref 26.0–34.0)
MCHC: 33 g/dL (ref 30.0–36.0)
MCV: 81.9 fL (ref 80.0–100.0)
Platelets: 242 10*3/uL (ref 150–400)
RBC: 5.18 MIL/uL — ABNORMAL HIGH (ref 3.87–5.11)
RDW: 14.7 % (ref 11.5–15.5)
WBC: 7.1 10*3/uL (ref 4.0–10.5)
nRBC: 0 % (ref 0.0–0.2)

## 2022-10-27 LAB — TROPONIN I (HIGH SENSITIVITY)
Troponin I (High Sensitivity): 7 ng/L (ref <18)
Troponin I (High Sensitivity): 7 ng/L (ref <18)

## 2022-10-27 LAB — LIPASE, BLOOD: Lipase: 70 U/L — ABNORMAL HIGH (ref 11–51)

## 2022-10-27 MED ORDER — HYDRALAZINE HCL 20 MG/ML IJ SOLN
10.0000 mg | INTRAMUSCULAR | Status: DC | PRN
  Administered 2022-10-28: 10 mg via INTRAVENOUS
  Filled 2022-10-27: qty 1

## 2022-10-27 MED ORDER — ACETAMINOPHEN 650 MG RE SUPP
650.0000 mg | Freq: Four times a day (QID) | RECTAL | Status: DC | PRN
  Administered 2022-10-28: 650 mg via RECTAL
  Filled 2022-10-27: qty 1

## 2022-10-27 MED ORDER — LACTATED RINGERS IV SOLN: INTRAVENOUS | Status: DC

## 2022-10-27 MED ORDER — ONDANSETRON HCL 4 MG/2ML IJ SOLN
4.0000 mg | Freq: Once | INTRAMUSCULAR | Status: AC
  Administered 2022-10-27: 4 mg via INTRAVENOUS
  Filled 2022-10-27: qty 2

## 2022-10-27 MED ORDER — LISINOPRIL 10 MG PO TABS
10.0000 mg | ORAL_TABLET | Freq: Every day | ORAL | Status: DC
  Administered 2022-10-28: 10 mg via ORAL
  Filled 2022-10-27 (×2): qty 1

## 2022-10-27 MED ORDER — SENNOSIDES-DOCUSATE SODIUM 8.6-50 MG PO TABS: 1.0000 | ORAL_TABLET | Freq: Every evening | ORAL | Status: DC | PRN

## 2022-10-27 MED ORDER — IOHEXOL 300 MG/ML  SOLN
100.0000 mL | Freq: Once | INTRAMUSCULAR | Status: AC | PRN
  Administered 2022-10-27: 85 mL via INTRAVENOUS

## 2022-10-27 MED ORDER — METOPROLOL SUCCINATE ER 25 MG PO TB24
200.0000 mg | ORAL_TABLET | Freq: Every day | ORAL | Status: DC
  Administered 2022-10-28: 200 mg via ORAL
  Filled 2022-10-27 (×2): qty 4

## 2022-10-27 MED ORDER — DILTIAZEM HCL ER COATED BEADS 240 MG PO CP24
240.0000 mg | ORAL_CAPSULE | Freq: Every day | ORAL | Status: DC
  Administered 2022-10-28: 240 mg via ORAL
  Filled 2022-10-27 (×3): qty 1

## 2022-10-27 MED ORDER — PANTOPRAZOLE SODIUM 40 MG PO TBEC
40.0000 mg | DELAYED_RELEASE_TABLET | Freq: Every day | ORAL | Status: DC
  Administered 2022-10-28: 40 mg via ORAL
  Filled 2022-10-27 (×2): qty 1

## 2022-10-27 MED ORDER — ONDANSETRON 4 MG PO TBDP
4.0000 mg | ORAL_TABLET | Freq: Three times a day (TID) | ORAL | Status: DC | PRN
  Filled 2022-10-27: qty 1

## 2022-10-27 MED ORDER — DOXYCYCLINE HYCLATE 100 MG PO CAPS: 100.0000 mg | ORAL_CAPSULE | Freq: Two times a day (BID) | ORAL | Status: DC

## 2022-10-27 MED ORDER — SENNOSIDES-DOCUSATE SODIUM 8.6-50 MG PO TABS
2.0000 | ORAL_TABLET | Freq: Two times a day (BID) | ORAL | Status: DC
  Administered 2022-10-27 – 2022-10-28 (×3): 2 via ORAL
  Filled 2022-10-27 (×3): qty 2

## 2022-10-27 MED ORDER — ALPRAZOLAM 0.5 MG PO TABS
0.5000 mg | ORAL_TABLET | Freq: Two times a day (BID) | ORAL | Status: DC
  Administered 2022-10-27 – 2022-10-28 (×3): 0.5 mg via ORAL
  Filled 2022-10-27 (×3): qty 1

## 2022-10-27 MED ORDER — ACETAMINOPHEN 325 MG PO TABS: 650.0000 mg | ORAL_TABLET | Freq: Four times a day (QID) | ORAL | Status: DC | PRN

## 2022-10-27 MED ORDER — ENOXAPARIN SODIUM 40 MG/0.4ML IJ SOSY
40.0000 mg | PREFILLED_SYRINGE | Freq: Every day | INTRAMUSCULAR | Status: DC
  Administered 2022-10-27 – 2022-10-28 (×2): 40 mg via SUBCUTANEOUS
  Filled 2022-10-27 (×2): qty 0.4

## 2022-10-27 MED ORDER — POLYETHYLENE GLYCOL 3350 17 G PO PACK
17.0000 g | PACK | Freq: Two times a day (BID) | ORAL | Status: DC
  Administered 2022-10-27 – 2022-10-28 (×2): 17 g via ORAL
  Filled 2022-10-27 (×3): qty 1

## 2022-10-27 MED ORDER — LEVOTHYROXINE SODIUM 50 MCG PO TABS: 50.0000 ug | ORAL_TABLET | Freq: Every day | ORAL | Status: DC

## 2022-10-27 MED ORDER — FUROSEMIDE 20 MG PO TABS
20.0000 mg | ORAL_TABLET | Freq: Every day | ORAL | Status: DC
  Administered 2022-10-27 – 2022-10-28 (×2): 20 mg via ORAL
  Filled 2022-10-27 (×2): qty 1

## 2022-10-27 MED ORDER — PRAVASTATIN SODIUM 20 MG PO TABS
20.0000 mg | ORAL_TABLET | Freq: Every day | ORAL | Status: DC
  Administered 2022-10-27 – 2022-10-28 (×2): 20 mg via ORAL
  Filled 2022-10-27 (×2): qty 1

## 2022-10-27 MED ORDER — ONDANSETRON HCL 4 MG/2ML IJ SOLN
4.0000 mg | Freq: Four times a day (QID) | INTRAMUSCULAR | Status: AC | PRN
  Administered 2022-10-27 – 2022-10-29 (×2): 4 mg via INTRAVENOUS
  Filled 2022-10-27 (×2): qty 2

## 2022-10-27 MED ORDER — DICYCLOMINE HCL 10 MG PO CAPS: 10.0000 mg | ORAL_CAPSULE | Freq: Two times a day (BID) | ORAL | Status: DC

## 2022-10-27 MED ORDER — SODIUM CHLORIDE 0.9 % IV SOLN: INTRAVENOUS | Status: AC

## 2022-10-27 NOTE — ED Provider Notes (Signed)
[facility] Provider Note   CSN: [id number] Arrival date & time: 10/04/2022  1025     History  Chief Complaint  Patient presents with   Abdominal Pain    Elizabeth Shields is a 89 y.o. female.  HPI 89 year old female presents today complaining of epigastric pain that began this morning around 430.  She had associated vomiting.  Pain has kind of waxed and waned.  She currently feels that it is improved but she continues to be nauseated.  She has not had anything to eat or drink due to this..    Home Medications Prior to Admission medications   Medication Sig Start Date End Date Taking? Authorizing Provider  acetaminophen (TYLENOL) 500 MG tablet Take 500 mg by mouth every 6 (six) hours as needed for pain.    [provider]  ALPRAZolam Duanne Moron) 0.5 MG tablet 0.5 mg 2 (two) times daily.    [provider]  aspirin 81 MG tablet Take 81 mg by mouth daily.    [provider]  dicyclomine (BENTYL) 10 MG capsule Take 10 mg by mouth 2 (two) times daily.    [provider]  diltiazem (DILACOR XR) 240 MG 24 hr capsule Take 240 mg by mouth daily.    [provider]  doxycycline (VIBRAMYCIN) 100 MG capsule Take 1 capsule (100 mg total) by mouth 2 (two) times daily. 03/30/18   Petrucelli, Samantha R, PA-C  fluticasone (FLONASE) 50 MCG/ACT nasal spray Place 1 spray into both nostrils daily. 03/30/18   Petrucelli, Samantha R, PA-C  levothyroxine (SYNTHROID, LEVOTHROID) 25 MCG tablet Take 25 mcg by mouth daily before breakfast.    [provider]  metoprolol succinate (TOPROL-XL) 100 MG 24 hr tablet Take 100 mg by mouth daily. Take with or immediately following a meal.    [provider]  Multiple Vitamins-Calcium (ONE-A-DAY WOMENS FORMULA PO) Take 1 tablet by mouth daily.    [provider]  ondansetron (ZOFRAN ODT) 4 MG disintegrating tablet Take 1 tablet (4 mg total) by mouth every 8  (eight) hours as needed for nausea or vomiting. 03/30/18   Petrucelli, Samantha R, PA-C  oxyCODONE (OXY IR/ROXICODONE) 5 MG immediate release tablet Take 1-2 tablets (5-10 mg total) by mouth every 4 (four) hours as needed. 05/21/13   Saverio Danker, PA-C  pantoprazole (PROTONIX) 40 MG tablet Take 40 mg by mouth daily.    [provider]  pravastatin (PRAVACHOL) 10 MG tablet Take 10 mg by mouth daily.    [provider]  raloxifene (EVISTA) 60 MG tablet Take 60 mg by mouth daily.    [provider]  TAZTIA XT 240 MG 24 hr capsule  06/04/13   [provider]      Allergies    Sulfa antibiotics    Review of Systems   Review of Systems  Physical Exam Updated Vital Signs BP (!) 193/92   Pulse 70   Temp 98.4 F (36.9 C) (Oral)   Resp 16   Ht 1.6 m (5\' 3" )   Wt 73.3 kg   SpO2 99%   BMI 28.63 kg/m  Physical Exam Vitals reviewed.  Constitutional:      Appearance: She is well-developed.  HENT:     Head: Normocephalic.     Mouth/Throat:     Mouth: Mucous membranes are moist.  Eyes:     Extraocular Movements: Extraocular movements intact.  Cardiovascular:     Rate and Rhythm: Normal rate and  regular rhythm.     Heart sounds: Normal heart sounds.  Pulmonary:     Effort: Pulmonary effort is normal.     Breath sounds: Normal breath sounds.  Abdominal:     General: Abdomen is flat. Bowel sounds are increased.     Palpations: Abdomen is soft.     Tenderness: There is abdominal tenderness in the epigastric area.  Skin:    General: Skin is warm and dry.  Neurological:     General: No focal deficit present.     Mental Status: She is alert.  Psychiatric:        Mood and Affect: Mood normal.     ED Results / Procedures / Treatments   Labs (all labs ordered are listed, but only abnormal results are displayed) Labs Reviewed  LIPASE, BLOOD - Abnormal; Notable for the following components:      Result Value   Lipase 70 (*)    All other components  within normal limits  COMPREHENSIVE METABOLIC PANEL - Abnormal; Notable for the following components:   Sodium 132 (*)    Glucose, Bld 136 (*)    Calcium 11.0 (*)    Total Protein 8.7 (*)    All other components within normal limits  CBC - Abnormal; Notable for the following components:   RBC 5.18 (*)    All other components within normal limits  URINALYSIS, ROUTINE W REFLEX MICROSCOPIC - Abnormal; Notable for the following components:   Color, Urine COLORLESS (*)    Protein, ur TRACE (*)    All other components within normal limits  TROPONIN I (HIGH SENSITIVITY)  TROPONIN I (HIGH SENSITIVITY)    EKG EKG Interpretation  Date/Time:  Thursday October 27 2022 11:26:01 EDT Ventricular Rate:  60 PR Interval:  253 QRS Duration: 118 QT Interval:  431 QTC Calculation: 431 R Axis:   -33 Text Interpretation: Sinus rhythm Prolonged PR interval LAD, consider left anterior fascicular block LVH with secondary repolarization abnormality Confirmed by Pattricia Boss [phone]) on 10/12/2022 11:38:00 AM  Radiology CT CHEST ABDOMEN PELVIS W CONTRAST  Result Date: 10/18/2022 CLINICAL DATA:  Epigastric pain since 4 a.m. this morning. Previous cholecystectomy. Some vomiting EXAM: CT CHEST, ABDOMEN, AND PELVIS WITH CONTRAST TECHNIQUE: Multidetector CT imaging of the chest, abdomen and pelvis was performed following the standard protocol during bolus administration of intravenous contrast. RADIATION DOSE REDUCTION: This exam was performed according to the departmental dose-optimization program which includes automated exposure control, adjustment of the mA and/or kV according to patient size and/or use of iterative reconstruction technique. CONTRAST:  44mL OMNIPAQUE IOHEXOL 300 MG/ML  SOLN COMPARISON:  MRI and CT 2014.  Chest x-Ericha Whittingham 2019 August FINDINGS: CT CHEST FINDINGS Cardiovascular: Trace pericardial fluid. The heart is nonenlarged. Coronary artery calcifications are seen. Calcifications along the aortic  valve. The thoracic aorta overall has a normal course and caliber. Mediastinum/Nodes: Preserved thyroid gland. Patulous esophagus. No specific abnormal lymph node enlargement identified in the axillary region, hilum or mediastinum. Lungs/Pleura: There is some linear opacity lung bases likely scar or atelectasis. No consolidation, pneumothorax or effusion. 3 mm right upper lobe nodule on series 4 image 35. Musculoskeletal: Mild degenerative changes along the spine. CT ABDOMEN PELVIS FINDINGS Hepatobiliary: Prior cholecystectomy. Patent portal vein. Benign-appearing hepatic cysts are again identified. Largest is seen left hepatic lobe on series 2 image 44 measuring 3.9 by 4.0 cm. Slightly larger than 2014 but again benign and no specific imaging follow-up. Pancreas: Unremarkable. No pancreatic ductal dilatation or surrounding inflammatory changes. Spleen:  Normal in size without focal abnormality. Adrenals/Urinary Tract: The adrenal glands are preserved. Upper pole right-sided benign-appearing renal cysts are identified. Largest measures 14 mm with Hounsfield unit of 10. Bosniak 1 lesions. No specific imaging follow-up. No collecting system dilatation. The ureters are seen extending down to the bladder. Preserved contours of the urinary bladder. Stomach/Bowel: Large bowel is of normal course and caliber with diffuse colonic stool and extensive colonic diverticulosis. Normal appendix. Stomach is nondilated. Dilated loops of fluid-filled small bowel in the right lower quadrant with diameter approaching 3.4 cm. There is an abrupt transition as seen on coronal image 66 and 67. Caliber change of 2 adjacent loops. Developing obstruction. With isolated area of please correlate also well for any evidence of an internal hernia. Mild mesenteric stranding. No free air or pneumatosis. Vascular/Lymphatic: Mild scattered vascular calcifications. Normal caliber aorta and IVC. No discrete abnormal lymph node enlargement identified in  the abdomen and pelvis. Reproductive: Uterus and bilateral adnexa are unremarkable. Other: No abdominal wall hernia or abnormality. No abdominopelvic ascites. Musculoskeletal: Moderate degenerative changes of the lumbar spine. Listhesis at L3-4. Multilevel disc bulging and stenosis. Degenerative changes also seen of the pelvis. IMPRESSION: Short-segment dilated loop of ileum in the right lower quadrant. There is a caliber change of 2 adjacent small bowel loops with some stranding. Please correlate for developing or partial obstruction. With the overall appearance and internal hernia would be in the differential versus adhesion. Mild adjacent inflammatory stranding but no pneumatosis or free air. Colonic diverticulosis. 3 mm right upper lobe lung nodule No follow-up needed if patient is low-risk.This recommendation follows the consensus statement: Guidelines for Management of Incidental Pulmonary Nodules Detected on CT Images: From the Fleischner Society 2017; Radiology 2017; 284:228-243. Electronically Signed   By: Jill Side M.D.   On: 10/17/2022 13:05    Procedures Procedures    Medications Ordered in ED Medications  ondansetron (ZOFRAN) injection 4 mg (4 mg Intravenous Given 10/17/2022 1130)  iohexol (OMNIPAQUE) 300 MG/ML solution 100 mL (85 mLs Intravenous Contrast Given 10/15/2022 1241)    ED Course/ Medical Decision Making/ A&P Clinical Course as of 10/18/2022 1350  Thu Oct 27, 2022  1315 CBC reviewed interpreted within normal limits Lipase elevated at 70 Complete metabolic panel reviewed interpreted significant for mild hyponatremia with sodium 132 Mild hyperglycemia with glucose 136 Hypercalcemia at 11,000 Transaminases are normal [DR]  1316 EKG reviewed interpreted and significant for probable LVH with some ST depression which may be secondary to the LVH.  Troponin is normal at 7 [DR]    Clinical Course User Index [DR] Pattricia Boss, MD                             Medical Decision  Making Amount and/or Complexity of Data Reviewed Labs: ordered. Radiology: ordered.  Risk Prescription drug management. Decision regarding hospitalization.  89 year old female presents today with upper abdominal pain nausea and vomiting. Differential diagnosis includes but is not limited to acute perforation, gastritis, cholelithiasis, pancreatitis, small bowel obstruction, diseases of the lower chest including MI. Reviewed prior records from admission 05/20/2013 At that time patient had some abdominal pain and was admitted with elevated lipase.  She was found to have inflammatory changes around pancreas, common bile duct, duodenum and hepatic flexure of the gallbladder dilatation of the common bile duct.  At that time she underwent lap chole and was improved prior to discharge I do not see any further medical records regarding  her abdominal pain or workup in the past 10 years within [facility] health or care everywhere CT reviewed and significant for short segment dilated loop of ileum in the right lower quadrant with a caliber change of adjacent small bowel loops with some stranding which may represent developing or partial obstruction with possible internal hernia versus adhesion. Patient has elevated lipase.  Last lipase I see in the system was 10 years ago.  Not clear whether she has cleared this previously or not. Care discussed with Dr. Redmond Pulling via the [facility] discussed with Dr.Adhikari-he will place orders.  He is aware that I spoke with Dr. Redmond Pulling at [facility] and they will reconsult appropriate general surgery after patient arrives to inpatient unit.       Final Clinical Impression(s) / ED Diagnoses Final diagnoses:  Epigastric pain  Elevated lipase  SBO (small bowel obstruction) ([employer])    Rx / DC Orders ED Discharge Orders     None         Pattricia Boss, MD 10/01/2022 1350

## 2022-10-27 NOTE — ED Notes (Signed)
Patient took own home medications as per Dr. Jeanell Sparrow

## 2022-10-27 NOTE — ED Triage Notes (Signed)
Pt arrives to ED with c/o epigastric abdominal pain that started at 4am this morning. Associated with vomiting.

## 2022-10-27 NOTE — H&P (Signed)
History and Physical    Elizabeth Shields [id number] DOB: 11-13-1931 DOA: 10/27/2022  PCP: Wilhelmina Mcardle, MD   Chief Complaint: n/v/d  HPI: Elizabeth Shields is a 89 y.o. female with medical history significant of diabetes, CHF, GERD, hypertension, hypothyroidism who presented to the emergency department with nausea vomiting and diarrhea that began earlier today.  Patient had a full meal yesterday which included a burger.  She woke up this morning feeling nauseous with some abdominal pain.  She also had some diarrhea so she presented to the ER.  On arrival she was afebrile hemodynamically stable.  Labs were obtained which revealed sodium 132, calcium 11, WBC 7.1, hemoglobin 14, troponin negative, urinalysis negative for infection.  Patient underwent CT chest abdomen pelvis which demonstrated short segment dilated loop of ileum with concerns for developing partial obstruction.  Patient was admitted for further management   Review of Systems: Review of Systems  All other systems reviewed and are negative.    As per HPI otherwise 10 point review of systems negative.   Allergies  Allergen Reactions   Sulfa Antibiotics Nausea And Vomiting    Past Medical History:  Diagnosis Date   Anxiety    Arthritis    Borderline diabetes    Diastolic CHF ([place]) [id number]   GERD (gastroesophageal reflux disease)    HOCM (hypertrophic obstructive cardiomyopathy) ([place])    a. Dx 2011 by Dr. Elsie Stain [facility];  b. 10/2012 Echo: EF 65-70% w/ dynamic obstruction @ rest in the outflow tract w/ peak velocity of 500 cm/sec & peak gradient of 156mmHg, nl wall motion, Gr 1 DD, sev SAM of the ant leaflet, mild to mod MR,pasp 33mmHg.    Hypertension    Hypothyroidism    Sleep apnea    Thyroid disease     Past Surgical History:  Procedure Laterality Date   CHOLECYSTECTOMY N/A 05/20/2013   Procedure: LAPAROSCOPIC CHOLECYSTECTOMY WITH INTRAOPERATIVE CHOLANGIOGRAM;  Surgeon:  Edward Jolly, MD;  Location: [facility];  Service: General;  Laterality: N/A;   TUBAL LIGATION       reports that she has never smoked. She has never used smokeless tobacco. She reports that she does not drink alcohol and does not use drugs.  Family History  Problem Relation Age of Onset   Heart disease Unknown        mother, father, 4 siblings - pt unaware of details    Prior to Admission medications   Medication Sig Start Date End Date Taking? Authorizing Provider  ACETAMINOPHEN PO Take 1,300 mg by mouth every 8 (eight) hours as needed for pain.   Yes [provider]  ALPRAZolam (XANAX) 0.5 MG tablet 0.5 mg 2 (two) times daily.   Yes [provider]  aspirin 81 MG tablet Take 81 mg by mouth daily.   Yes [provider]  dicyclomine (BENTYL) 10 MG capsule Take 10 mg by mouth 2 (two) times daily.   Yes [provider]  diltiazem (DILACOR XR) 240 MG 24 hr capsule Take 240 mg by mouth daily.   Yes [provider]  doxycycline (VIBRAMYCIN) 100 MG capsule Take 1 capsule (100 mg total) by mouth 2 (two) times daily. 03/30/18  Yes Petrucelli, Samantha R, PA-C  furosemide (LASIX) 20 MG tablet Take 20 mg by mouth daily.   Yes [provider]  levothyroxine (SYNTHROID, LEVOTHROID) 25 MCG tablet Take 50 mcg by mouth daily before breakfast.   Yes [provider]  metoprolol succinate (TOPROL-XL) 100 MG 24 hr tablet Take 200 mg by mouth daily. Take with or immediately following a meal.   Yes [provider]  pantoprazole (PROTONIX) 40 MG tablet Take 40 mg by mouth daily.   Yes [provider]  potassium chloride (KLOR-CON) 10 MEQ tablet Take 20 mEq by mouth daily. 10/06/22  Yes [provider]  pravastatin (PRAVACHOL) 10 MG tablet Take 20 mg by mouth daily.   Yes [provider]  Vitamin D, Cholecalciferol, 25 MCG (1000 UT) TABS Take 1 tablet by mouth daily.   Yes [provider]  fluticasone  (FLONASE) 50 MCG/ACT nasal spray Place 1 spray into both nostrils daily. Patient not taking: Reported on 10/27/2022 03/30/18   Petrucelli, Samantha R, PA-C  ondansetron (ZOFRAN ODT) 4 MG disintegrating tablet Take 1 tablet (4 mg total) by mouth every 8 (eight) hours as needed for nausea or vomiting. Patient not taking: Reported on 10/27/2022 03/30/18   Petrucelli, Aldona Bar R, PA-C  oxyCODONE (OXY IR/ROXICODONE) 5 MG immediate release tablet Take 1-2 tablets (5-10 mg total) by mouth every 4 (four) hours as needed. Patient not taking: Reported on 10/27/2022 05/21/13   Saverio Danker, PA-C    Physical Exam: Vitals:   10/27/22 1315 10/27/22 1330 10/27/22 1431 10/27/22 1510  BP: (!) 202/105 (!) 193/92  (!) 157/95  Pulse: 76 70  74  Resp:  16  15  Temp:   98.4 F (36.9 C) 98 F (36.7 C)  TempSrc:   Oral Oral  SpO2: 96% 99%  100%  Weight:      Height:       Physical Exam Constitutional:      Appearance: She is normal weight.  Cardiovascular:     Rate and Rhythm: Normal rate and regular rhythm.  Pulmonary:     Effort: Pulmonary effort is normal.     Breath sounds: Normal breath sounds.  Abdominal:     General: Abdomen is flat. Bowel sounds are normal.     Palpations: Abdomen is soft.  Skin:    General: Skin is warm.     Capillary Refill: Capillary refill takes less than 2 seconds.  Neurological:     General: No focal deficit present.     Mental Status: She is alert and oriented to person, place, and time.        Labs on Admission: I have personally reviewed the patients's labs and imaging studies.  Assessment/Plan Principal Problem:   SBO (small bowel obstruction) (HCC) Active Problems:   Small bowel obstruction (H[facility])   # Partial small bowel obstruction-patient is not distended and her belly is soft.  She also has an appetite.  Suspicion for acute small bowel obstruction is extremely low given her benign exam and lack of pain or active nausea or vomiting.  We can advance her  diet to clear liquids to see if she can tolerate.  Will also provide antiemetics if she would develop symptoms.  I will place her on a bowel regimen to.  She states she has been having normal bowel movements at home prior to presentation.  A-fib-continue diltiazem, metoprolol  Hx of HOCM, Heart failure with preserved ejection fraction, not in exacerbation-continue daily Lasix  Hypothyroidism-continue levothyroxine, check TSH  Hypercalcemia-placed on normal saline infusion, labs in morning  Generalized anxiety-continue home Xanax  History of abdominal pain-hold Bentyl given concern for small bowel obstruction  Hyperlipidemia-continue statin  Hypertension-start lisinopril   Admission status: Observation Med-Surg  Certification: The appropriate patient status  for this patient is OBSERVATION. Observation status is judged to be reasonable and necessary in order to provide the required intensity of service to ensure the patient's safety. The patient's presenting symptoms, physical exam findings, and initial radiographic and laboratory data in the context of their medical condition is felt to place them at decreased risk for further clinical deterioration. Furthermore, it is anticipated that the patient will be medically stable for discharge from the hospital within 2 midnights of admission.     Emilee Hero MD [facility] If 7PM-7AM, please contact night-coverage [email or web address]  10/27/2022, 3:42 PM

## 2022-10-27 NOTE — ED Notes (Signed)
[facility] at bedside 

## 2022-10-27 NOTE — Consult Note (Signed)
Elizabeth Shields [birth year]  [id number].    Requesting MD: Dr. Pattricia Boss Chief Complaint/Reason for Consult: SBO  HPI: Elizabeth Shields is a 89 y.o. female who presented to the ED for abdominal pain that began at 430am this morning. Pain is located in her epigastrium, waxing and waning without associated nausea and vomiting. No flatus or bm since symptom onset. Denies associated fever or chills. Denies hx of similar symptoms in the past.   In the ED she was afebrile without tachycardia or hypotension. Actually hypertensive to 205/105. WBC 7.1. Cr wnl. K 4.5. Lipase 70. LFT's wnl. Tn wnl x 2. CT CAP w/ short-segment dilated loop of ileum in the right lower quadrant. There is a caliber change of 2 adjacent small bowel loops with some stranding. No pneumatosis or free air.  Prior Abdominal Surgeries: Cholecystectomy, Tubal Ligatoin PMHx: HTN, Hypothroidism, OSA, CHF/HOCM Last Colonoscopy: None on file Blood Thinners: ASA, otherwise none Allergies: Sulfa Tobacco Use: None Alcohol Use: None Substance use: None  Employment: Retired  ROS: As above, see hpi  Family History  Problem Relation Age of Onset   Heart disease Unknown        mother, father, 4 siblings - pt unaware of details    Past Medical History:  Diagnosis Date   Anxiety    Arthritis    Borderline diabetes    Diastolic CHF ([place]) [id number]   GERD (gastroesophageal reflux disease)    HOCM (hypertrophic obstructive cardiomyopathy) ([place])    a. Dx 2011 by Dr. Elsie Stain [facility];  b. 10/2012 Echo: EF 65-70% w/ dynamic obstruction @ rest in the outflow tract w/ peak velocity of 500 cm/sec & peak gradient of 146mmHg, nl wall motion, Gr 1 DD, sev SAM of the ant leaflet, mild to mod MR,pasp 66mmHg.    Hypertension    Hypothyroidism    Sleep apnea    Thyroid disease     Past Surgical History:  Procedure Laterality Date   CHOLECYSTECTOMY N/A 05/20/2013   Procedure: LAPAROSCOPIC  CHOLECYSTECTOMY WITH INTRAOPERATIVE CHOLANGIOGRAM;  Surgeon: Edward Jolly, MD;  Location: [facility];  Service: General;  Laterality: N/A;   TUBAL LIGATION      Social History:  reports that she has never smoked. She has never used smokeless tobacco. She reports that she does not drink alcohol and does not use drugs.  Allergies:  Allergies  Allergen Reactions   Sulfa Antibiotics Nausea And Vomiting    Medications Prior to Admission  Medication Sig Dispense Refill   ACETAMINOPHEN PO Take 1,300 mg by mouth every 8 (eight) hours as needed for pain.     ALPRAZolam (XANAX) 0.5 MG tablet 0.5 mg 2 (two) times daily.     aspirin 81 MG tablet Take 81 mg by mouth daily.     dicyclomine (BENTYL) 10 MG capsule Take 10 mg by mouth 2 (two) times daily.     diltiazem (DILACOR XR) 240 MG 24 hr capsule Take 240 mg by mouth daily.     doxycycline (VIBRAMYCIN) 100 MG capsule Take 1 capsule (100 mg total) by mouth 2 (two) times daily. 14 capsule 0   furosemide (LASIX) 20 MG tablet Take 20 mg by mouth daily.     levothyroxine (SYNTHROID, LEVOTHROID) 25 MCG tablet Take 50 mcg by mouth daily before breakfast.     metoprolol succinate (TOPROL-XL) 100 MG 24 hr tablet Take 200 mg by mouth daily. Take with or immediately following a meal.  pantoprazole (PROTONIX) 40 MG tablet Take 40 mg by mouth daily.     potassium chloride (KLOR-CON) 10 MEQ tablet Take 20 mEq by mouth daily.     pravastatin (PRAVACHOL) 10 MG tablet Take 20 mg by mouth daily.     Vitamin D, Cholecalciferol, 25 MCG (1000 UT) TABS Take 1 tablet by mouth daily.     fluticasone (FLONASE) 50 MCG/ACT nasal spray Place 1 spray into both nostrils daily. (Patient not taking: Reported on 10/26/2022) 16 g 0   ondansetron (ZOFRAN ODT) 4 MG disintegrating tablet Take 1 tablet (4 mg total) by mouth every 8 (eight) hours as needed for nausea or vomiting. (Patient not taking: Reported on 10/15/2022) 5 tablet 0   oxyCODONE (OXY IR/ROXICODONE) 5 MG immediate  release tablet Take 1-2 tablets (5-10 mg total) by mouth every 4 (four) hours as needed. (Patient not taking: Reported on 10/20/2022) 30 tablet 0     Physical Exam: Blood pressure (!) 157/95, pulse 74, temperature 98 F (36.7 C), temperature source Oral, resp. rate 15, height 5\' 3"  (1.6 m), weight 73.3 kg, SpO2 100 %. General: pleasant, WD/WN female who is laying in bed in NAD HEENT: head is normocephalic, atraumatic.  Sclera are noninjected.  PERRL.  Ears and nose without any masses or lesions.  Mouth is pink and moist. Dentition fair Heart: regular, rate, and rhythm.  Palpable pedal pulses bilaterally  Lungs: CTAB, no wheezes, rhonchi, or rales noted.  Respiratory effort nonlabored Abd: Mild to moderate distension but soft. no ttp. No rigidity or guarding. +BS. No masses, hernias, or organomegaly MS: no BUE or BLE edema, calves soft and nontender Skin: warm and dry  Psych: A&Ox4 with an appropriate affect Neuro: cranial nerves grossly intact, normal speech, thought process intact, moves all extremities, gait not assessed   Results for orders placed or performed during the hospital encounter of 10/22/2022 (from the past 48 hour(s))  Lipase, blood     Status: Abnormal   Collection Time: 10/28/2022 11:20 AM  Result Value Ref Range   Lipase 70 (H) 11 - 51 U/L    Comment: Performed at [facility], [place], [place] [address]  Comprehensive metabolic panel     Status: Abnormal   Collection Time: 10/14/2022 11:20 AM  Result Value Ref Range   Sodium 132 (L) 135 - 145 mmol/L   Potassium 4.5 3.5 - 5.1 mmol/L   Chloride 99 98 - 111 mmol/L   CO2 23 22 - 32 mmol/L   Glucose, Bld 136 (H) 70 - 99 mg/dL    Comment: Glucose reference range applies only to samples taken after fasting for at least 8 hours.   BUN 19 8 - 23 mg/dL   Creatinine, Ser 0.91 0.44 - 1.00 mg/dL   Calcium 11.0 (H) 8.9 - 10.3 mg/dL   Total Protein 8.7 (H) 6.5 - 8.1 g/dL   Albumin 4.6 3.5 - 5.0  g/dL   AST 20 15 - 41 U/L   ALT 12 0 - 44 U/L   Alkaline Phosphatase 123 38 - 126 U/L   Total Bilirubin 0.6 0.3 - 1.2 mg/dL   GFR, Estimated >60 >60 mL/min    Comment: (NOTE) Calculated using the CKD-EPI Creatinine Equation (2021)    Anion gap 10 5 - 15    Comment: Performed at [facility], [address] [place], [place] [address]  CBC     Status: Abnormal   Collection Time: 10/02/2022 11:20 AM  Result Value Ref Range  WBC 7.1 4.0 - 10.5 K/uL   RBC 5.18 (H) 3.87 - 5.11 MIL/uL   Hemoglobin 14.0 12.0 - 15.0 g/dL   HCT 42.4 36.0 - 46.0 %   MCV 81.9 80.0 - 100.0 fL   MCH 27.0 26.0 - 34.0 pg   MCHC 33.0 30.0 - 36.0 g/dL   RDW 14.7 11.5 - 15.5 %   Platelets 242 150 - 400 K/uL   nRBC 0.0 0.0 - 0.2 %    Comment: Performed at [facility], [place], [place] [address]  Troponin I (High Sensitivity)     Status: None   Collection Time: 10/03/2022 11:20 AM  Result Value Ref Range   Troponin I (High Sensitivity) 7 <18 ng/L    Comment: (NOTE) Elevated high sensitivity troponin I (hsTnI) values and significant  changes across serial measurements may suggest ACS but many other  chronic and acute conditions are known to elevate hsTnI results.  Refer to the "Links" section for chest pain algorithms and additional  guidance. Performed at [facility], [address], [place], [place] [address]   Urinalysis, Routine w reflex microscopic -Urine, Clean Catch     Status: Abnormal   Collection Time: 10/10/2022  1:25 PM  Result Value Ref Range   Color, Urine COLORLESS (A) YELLOW   APPearance CLEAR CLEAR   Specific Gravity, Urine 1.020 1.005 - 1.030   pH 6.5 5.0 - 8.0   Glucose, UA NEGATIVE NEGATIVE mg/dL   Hgb urine dipstick NEGATIVE NEGATIVE   Bilirubin Urine NEGATIVE NEGATIVE   Ketones, ur NEGATIVE NEGATIVE mg/dL   Protein, ur TRACE (A) NEGATIVE mg/dL   Nitrite NEGATIVE NEGATIVE   Leukocytes,Ua NEGATIVE NEGATIVE     Comment: Performed at [facility], [address], [place], [place] [address]  Troponin I (High Sensitivity)     Status: None   Collection Time: 10/16/2022  1:30 PM  Result Value Ref Range   Troponin I (High Sensitivity) 7 <18 ng/L    Comment: (NOTE) Elevated high sensitivity troponin I (hsTnI) values and significant  changes across serial measurements may suggest ACS but many other  chronic and acute conditions are known to elevate hsTnI results.  Refer to the "Links" section for chest pain algorithms and additional  guidance. Performed at [facility], [address], [place], [place] [address]    CT CHEST ABDOMEN PELVIS W CONTRAST  Result Date: 10/20/2022 CLINICAL DATA:  Epigastric pain since 4 a.m. this morning. Previous cholecystectomy. Some vomiting EXAM: CT CHEST, ABDOMEN, AND PELVIS WITH CONTRAST TECHNIQUE: Multidetector CT imaging of the chest, abdomen and pelvis was performed following the standard protocol during bolus administration of intravenous contrast. RADIATION DOSE REDUCTION: This exam was performed according to the departmental dose-optimization program which includes automated exposure control, adjustment of the mA and/or kV according to patient size and/or use of iterative reconstruction technique. CONTRAST:  22mL OMNIPAQUE IOHEXOL 300 MG/ML  SOLN COMPARISON:  MRI and CT 2014.  Chest x-ray 2019 August FINDINGS: CT CHEST FINDINGS Cardiovascular: Trace pericardial fluid. The heart is nonenlarged. Coronary artery calcifications are seen. Calcifications along the aortic valve. The thoracic aorta overall has a normal course and caliber. Mediastinum/Nodes: Preserved thyroid gland. Patulous esophagus. No specific abnormal lymph node enlargement identified in the axillary region, hilum or mediastinum. Lungs/Pleura: There is some linear opacity lung bases likely scar or atelectasis. No consolidation, pneumothorax or effusion. 3 mm right upper lobe  nodule on series 4 image 35. Musculoskeletal: Mild degenerative changes along  the spine. CT ABDOMEN PELVIS FINDINGS Hepatobiliary: Prior cholecystectomy. Patent portal vein. Benign-appearing hepatic cysts are again identified. Largest is seen left hepatic lobe on series 2 image 44 measuring 3.9 by 4.0 cm. Slightly larger than 2014 but again benign and no specific imaging follow-up. Pancreas: Unremarkable. No pancreatic ductal dilatation or surrounding inflammatory changes. Spleen: Normal in size without focal abnormality. Adrenals/Urinary Tract: The adrenal glands are preserved. Upper pole right-sided benign-appearing renal cysts are identified. Largest measures 14 mm with Hounsfield unit of 10. Bosniak 1 lesions. No specific imaging follow-up. No collecting system dilatation. The ureters are seen extending down to the bladder. Preserved contours of the urinary bladder. Stomach/Bowel: Large bowel is of normal course and caliber with diffuse colonic stool and extensive colonic diverticulosis. Normal appendix. Stomach is nondilated. Dilated loops of fluid-filled small bowel in the right lower quadrant with diameter approaching 3.4 cm. There is an abrupt transition as seen on coronal image 66 and 67. Caliber change of 2 adjacent loops. Developing obstruction. With isolated area of please correlate also well for any evidence of an internal hernia. Mild mesenteric stranding. No free air or pneumatosis. Vascular/Lymphatic: Mild scattered vascular calcifications. Normal caliber aorta and IVC. No discrete abnormal lymph node enlargement identified in the abdomen and pelvis. Reproductive: Uterus and bilateral adnexa are unremarkable. Other: No abdominal wall hernia or abnormality. No abdominopelvic ascites. Musculoskeletal: Moderate degenerative changes of the lumbar spine. Listhesis at L3-4. Multilevel disc bulging and stenosis. Degenerative changes also seen of the pelvis. IMPRESSION: Short-segment dilated loop of ileum in  the right lower quadrant. There is a caliber change of 2 adjacent small bowel loops with some stranding. Please correlate for developing or partial obstruction. With the overall appearance and internal hernia would be in the differential versus adhesion. Mild adjacent inflammatory stranding but no pneumatosis or free air. Colonic diverticulosis. 3 mm right upper lobe lung nodule No follow-up needed if patient is low-risk.This recommendation follows the consensus statement: Guidelines for Management of Incidental Pulmonary Nodules Detected on CT Images: From the Fleischner Society 2017; Radiology 2017; 284:228-243. Electronically Signed   By: Jill Side M.D.   On: 10/15/2022 13:05    Anti-infectives (From admission, onward)    Start     Dose/Rate Route Frequency Ordered Stop   10/26/2022 2200  doxycycline (VIBRAMYCIN) capsule 100 mg  Status:  Discontinued        100 mg Oral 2 times daily 10/06/2022 1541 10/26/2022 1549       Assessment/Plan SBO - CT w/ short-segment dilated loop of ileum in the right lower quadrant with caliber change of 2 adjacent small bowel loops with some stranding. No pneumatosis or free air.  - Afebrile. No tachycardia or hypotension. WBC wnl. No peritonitis on exam.  No current indication for emergency surgery - ok to have ice chips, repeat film in AM - if no bowel function then likely will initiate SBO protocol tomorrow if having bowel function tomorrow then possibly start CLD tomorrow  - Keep K > 4 and Mg > 2 for bowel function - Mobilize for bowel function - Hopefully patient will improve with conservative management. If patient fails to improve with conservative management, they may require exploratory surgery during admission - Agree with medical admission. We will follow with you.  FEN - ice chips, IVF per TRH VTE - SCDs, okay for chemical ppx from a general surgery standpoint ID - None Dispo - Admit to [facility].   Elevated lipase - not consistent with pancreatitis and  no  signs of acute or chronic pancreatic inflammation on imaging  HTN Hypothyroidism OSA Hx CHF/HOCM Hepatic and Renal Cysts on CT Pulm Nodule on CT - Rec PCP f/u and surviellence as recommended by Radiology.   I reviewed ED provider notes, last 24 h vitals and pain scores, last 48 h intake and output, last 24 h labs and trends, and last 24 h imaging results  Barkley Boards, [facility] Surgery 10/11/2022, 4:00 PM Please see Amion for pager number during day hours 7:00am-4:30pm

## 2022-10-27 NOTE — ED Notes (Signed)
Elizabeth Shields at [facility] will send transport for Bed Ready at [facility] Room#[id number].-ABB(NS)

## 2022-10-28 ENCOUNTER — Inpatient Hospital Stay (HOSPITAL_COMMUNITY): Payer: Medicare Other

## 2022-10-28 DIAGNOSIS — I5032 Chronic diastolic (congestive) heart failure: Secondary | ICD-10-CM

## 2022-10-28 DIAGNOSIS — I4891 Unspecified atrial fibrillation: Secondary | ICD-10-CM | POA: Insufficient documentation

## 2022-10-28 DIAGNOSIS — K56609 Unspecified intestinal obstruction, unspecified as to partial versus complete obstruction: Secondary | ICD-10-CM | POA: Diagnosis not present

## 2022-10-28 DIAGNOSIS — E871 Hypo-osmolality and hyponatremia: Secondary | ICD-10-CM | POA: Diagnosis not present

## 2022-10-28 LAB — COMPREHENSIVE METABOLIC PANEL
ALT: 15 U/L (ref 0–44)
AST: 22 U/L (ref 15–41)
Albumin: 4.3 g/dL (ref 3.5–5.0)
Alkaline Phosphatase: 119 U/L (ref 38–126)
Anion gap: 11 (ref 5–15)
BUN: 15 mg/dL (ref 8–23)
CO2: 23 mmol/L (ref 22–32)
Calcium: 10.1 mg/dL (ref 8.9–10.3)
Chloride: 95 mmol/L — ABNORMAL LOW (ref 98–111)
Creatinine, Ser: 0.84 mg/dL (ref 0.44–1.00)
GFR, Estimated: >60 mL/min (ref >60)
Glucose, Bld: 156 mg/dL — ABNORMAL HIGH (ref 70–99)
Potassium: 3.7 mmol/L (ref 3.5–5.1)
Sodium: 129 mmol/L — ABNORMAL LOW (ref 135–145)
Total Bilirubin: 1 mg/dL (ref 0.3–1.2)
Total Protein: 8.1 g/dL (ref 6.5–8.1)

## 2022-10-28 LAB — TSH: TSH: 2.781 u[IU]/mL (ref 0.350–4.500)

## 2022-10-28 MED ORDER — PROCHLORPERAZINE EDISYLATE 10 MG/2ML IJ SOLN
5.0000 mg | Freq: Once | INTRAMUSCULAR | Status: AC
  Administered 2022-10-28: 5 mg via INTRAVENOUS
  Filled 2022-10-28: qty 2

## 2022-10-28 MED ORDER — BISACODYL 10 MG RE SUPP
10.0000 mg | Freq: Once | RECTAL | Status: AC
  Administered 2022-10-28: 10 mg via RECTAL
  Filled 2022-10-28: qty 1

## 2022-10-28 MED ORDER — PROCHLORPERAZINE EDISYLATE 10 MG/2ML IJ SOLN
5.0000 mg | Freq: Four times a day (QID) | INTRAMUSCULAR | Status: DC | PRN
  Administered 2022-10-28: 5 mg via INTRAVENOUS
  Filled 2022-10-28: qty 2

## 2022-10-28 NOTE — TOC CM/SW Note (Signed)
  Transition of Care [facility]) Screening Note   Patient Details  Name: ADELA NILSSON Date of Birth: 02/28/1932   Transition of Care [facility]) CM/SW Contact:    Lennart Pall, LCSW Phone Number: 10/28/2022, 9:24 AM    Transition of Care Department [facility]) has reviewed patient and no TOC needs have been identified at this time. We will continue to monitor patient advancement through interdisciplinary progression rounds. If new patient transition needs arise, please place a TOC consult.

## 2022-10-28 NOTE — Progress Notes (Signed)
Subjective/Chief Complaint: Pt doing well this AM Some nausea last night No bowel fxn   Objective: Vital signs in last 24 hours: Temp:  [97.7 F (36.5 C)-98.4 F (36.9 C)] 97.7 F (36.5 C) (03/29 0453) Pulse Rate:  [47-76] 65 (03/29 0557) Resp:  [15-18] 18 (03/29 0453) BP: (134-202)/(56-105) 148/67 (03/29 0557) SpO2:  [96 %-100 %] 97 % (03/29 0453) Weight:  [73.3 kg] 73.3 kg (03/28 1041) Last BM Date : 10/26/22  Intake/Output from previous day: 03/28 0701 - 03/29 0700 In: 1527.9 [P.O.:270; I.V.:1257.9] Out: 700 [Urine:700] Intake/Output this shift: Total I/O In: 196.7 [I.V.:196.7] Out: -   PE:  Constitutional: No acute distress, conversant, appears states age. Eyes: Anicteric sclerae, moist conjunctiva, no lid lag Lungs: Clear to auscultation bilaterally, normal respiratory effort CV: regular rate and rhythm, no murmurs, no peripheral edema, pedal pulses 2+ GI: Soft, no masses or hepatosplenomegaly, non-tender to palpation Skin: No rashes, palpation reveals normal turgor Psychiatric: appropriate judgment and insight, oriented to person, place, and time  Lab Results:  Recent Labs    10/19/2022 1120  WBC 7.1  HGB 14.0  HCT 42.4  PLT 242   BMET Recent Labs    10/06/2022 1120 10/28/22 0422  NA 132* 129*  K 4.5 3.7  CL 99 95*  CO2 23 23  GLUCOSE 136* 156*  BUN 19 15  CREATININE 0.91 0.84  CALCIUM 11.0* 10.1   PT/INR No results for input(s): "LABPROT", "INR" in the last 72 hours. ABG No results for input(s): "PHART", "HCO3" in the last 72 hours.  Invalid input(s): "PCO2", "PO2"  Studies/Results: DG Abd Portable 1V  Result Date: 10/28/2022 CLINICAL DATA:  Small bowel obstruction. EXAM: PORTABLE ABDOMEN - 1 VIEW COMPARISON:  November 05, 2012. FINDINGS: The bowel gas pattern is normal. No radio-opaque calculi or other significant radiographic abnormality are seen. IMPRESSION: Negative. Electronically Signed   By: Marijo Conception M.D.   On: 10/28/2022 08:22    CT CHEST ABDOMEN PELVIS W CONTRAST  Result Date: 10/16/2022 CLINICAL DATA:  Epigastric pain since 4 a.m. this morning. Previous cholecystectomy. Some vomiting EXAM: CT CHEST, ABDOMEN, AND PELVIS WITH CONTRAST TECHNIQUE: Multidetector CT imaging of the chest, abdomen and pelvis was performed following the standard protocol during bolus administration of intravenous contrast. RADIATION DOSE REDUCTION: This exam was performed according to the departmental dose-optimization program which includes automated exposure control, adjustment of the mA and/or kV according to patient size and/or use of iterative reconstruction technique. CONTRAST:  57mL OMNIPAQUE IOHEXOL 300 MG/ML  SOLN COMPARISON:  MRI and CT 2014.  Chest x-ray 2019 August FINDINGS: CT CHEST FINDINGS Cardiovascular: Trace pericardial fluid. The heart is nonenlarged. Coronary artery calcifications are seen. Calcifications along the aortic valve. The thoracic aorta overall has a normal course and caliber. Mediastinum/Nodes: Preserved thyroid gland. Patulous esophagus. No specific abnormal lymph node enlargement identified in the axillary region, hilum or mediastinum. Lungs/Pleura: There is some linear opacity lung bases likely scar or atelectasis. No consolidation, pneumothorax or effusion. 3 mm right upper lobe nodule on series 4 image 35. Musculoskeletal: Mild degenerative changes along the spine. CT ABDOMEN PELVIS FINDINGS Hepatobiliary: Prior cholecystectomy. Patent portal vein. Benign-appearing hepatic cysts are again identified. Largest is seen left hepatic lobe on series 2 image 44 measuring 3.9 by 4.0 cm. Slightly larger than 2014 but again benign and no specific imaging follow-up. Pancreas: Unremarkable. No pancreatic ductal dilatation or surrounding inflammatory changes. Spleen: Normal in size without focal abnormality. Adrenals/Urinary Tract: The adrenal glands are preserved. Upper  pole right-sided benign-appearing renal cysts are identified.  Largest measures 14 mm with Hounsfield unit of 10. Bosniak 1 lesions. No specific imaging follow-up. No collecting system dilatation. The ureters are seen extending down to the bladder. Preserved contours of the urinary bladder. Stomach/Bowel: Large bowel is of normal course and caliber with diffuse colonic stool and extensive colonic diverticulosis. Normal appendix. Stomach is nondilated. Dilated loops of fluid-filled small bowel in the right lower quadrant with diameter approaching 3.4 cm. There is an abrupt transition as seen on coronal image 66 and 67. Caliber change of 2 adjacent loops. Developing obstruction. With isolated area of please correlate also well for any evidence of an internal hernia. Mild mesenteric stranding. No free air or pneumatosis. Vascular/Lymphatic: Mild scattered vascular calcifications. Normal caliber aorta and IVC. No discrete abnormal lymph node enlargement identified in the abdomen and pelvis. Reproductive: Uterus and bilateral adnexa are unremarkable. Other: No abdominal wall hernia or abnormality. No abdominopelvic ascites. Musculoskeletal: Moderate degenerative changes of the lumbar spine. Listhesis at L3-4. Multilevel disc bulging and stenosis. Degenerative changes also seen of the pelvis. IMPRESSION: Short-segment dilated loop of ileum in the right lower quadrant. There is a caliber change of 2 adjacent small bowel loops with some stranding. Please correlate for developing or partial obstruction. With the overall appearance and internal hernia would be in the differential versus adhesion. Mild adjacent inflammatory stranding but no pneumatosis or free air. Colonic diverticulosis. 3 mm right upper lobe lung nodule No follow-up needed if patient is low-risk.This recommendation follows the consensus statement: Guidelines for Management of Incidental Pulmonary Nodules Detected on CT Images: From the Fleischner Society 2017; Radiology 2017; 284:228-243. Electronically Signed   By:  Jill Side M.D.   On: 10/10/2022 13:05    Anti-infectives: Anti-infectives (From admission, onward)    Start     Dose/Rate Route Frequency Ordered Stop   10/08/2022 2200  doxycycline (VIBRAMYCIN) capsule 100 mg  Status:  Discontinued        100 mg Oral 2 times daily 10/11/2022 1541 10/17/2022 1549       Assessment/Plan: SBO -no bowerl fxn, will attempt supp - Keep K > 4 and Mg > 2 for bowel function - Mobilize for bowel function - Hopefully patient will improve with conservative management. If patient fails to improve with conservative management, they may require exploratory surgery during admission -Will follow with you.   FEN - ice chips, IVF per [facility] VTE - SCDs, okay for chemical ppx from a general surgery standpoint ID - None    Elevated lipase - not consistent with pancreatitis and no signs of acute or chronic pancreatic inflammation on imaging  HTN Hypothyroidism OSA Hx CHF/HOCM Hepatic and Renal Cysts on CT Pulm Nodule on CT - Rec PCP f/u and surviellence as recommended by Radiology.    LOS: 1 day    Ralene Ok 10/28/2022

## 2022-10-28 NOTE — Hospital Course (Addendum)
89 year old woman presented with nausea and vomiting.  Admitted for partial small bowel obstruction. 3/28: Admit 3/29: Stable, conservative management per surgery 3/30: hypotensive, hypothermic, transferred to SDU  Consultants General surgery  PCCM  Procedures

## 2022-10-28 NOTE — Progress Notes (Signed)
  Progress Note   Patient: Elizabeth Shields [id number] DOB: 1931-12-31 DOA: 10/13/2022     1 DOS: the patient was seen and examined on 10/28/2022   Brief hospital course: 89 year old woman presented with nausea and vomiting.  Admitted for partial small bowel obstruction.  Assessment and Plan: Partial small bowel obstruction Patient appears clinically stable.  Nontender on exam.  Continue management per general surgery.   A-fib Continue diltiazem, metoprolol   Chronic diastolic CHF Hold Lasix for now.   Hypothyroidism Resume levothyroxine when diet advanced  Elevated lipase. Clinically insignificant.  CT without any evidence to suggest pancreatitis.  Hyponatremia  Mild.  Probably secondary to the poor oral intake.  IV fluids.  Trend BMP.  Hypercalcemia ruled out.  Initial elevated value secondary to hemoconcentration.   Generalized anxiety continue home Xanax      Subjective:  Feels ok No pain  Physical Exam: Vitals:   10/28/22 0103 10/28/22 0453 10/28/22 0557 10/28/22 1319  BP: (!) 169/76 (!) 183/81 (!) 148/67 (!) 152/80  Pulse: 64 65 65 70  Resp: 16 18    Temp: 98.4 F (36.9 C) 97.7 F (36.5 C)  98.4 F (36.9 C)  TempSrc: Oral Oral  Oral  SpO2: 97% 97%  97%  Weight:      Height:       Physical Exam Vitals reviewed.  Constitutional:      General: She is not in acute distress.    Appearance: She is not ill-appearing or toxic-appearing.  Cardiovascular:     Rate and Rhythm: Normal rate and regular rhythm.     Heart sounds: No murmur heard. Pulmonary:     Effort: Pulmonary effort is normal. No respiratory distress.     Breath sounds: No wheezing, rhonchi or rales.  Abdominal:     Palpations: Abdomen is soft.     Tenderness: There is no abdominal tenderness.  Neurological:     Mental Status: She is alert.  Psychiatric:        Mood and Affect: Mood normal.        Behavior: Behavior normal.     Data Reviewed: Na+ 129 CBG 156 AXR  negative  Family Communication: daughter at bedside  Disposition: Status is: Inpatient Remains inpatient appropriate because: SBO  Planned Discharge Destination: Home    Time spent: 20 minutes  Author: Murray Hodgkins, MD 10/28/2022 3:48 PM  For on call review [email or web address].

## 2022-10-29 ENCOUNTER — Other Ambulatory Visit: Payer: Self-pay

## 2022-10-29 ENCOUNTER — Inpatient Hospital Stay (HOSPITAL_COMMUNITY): Payer: Medicare Other

## 2022-10-29 ENCOUNTER — Encounter (HOSPITAL_COMMUNITY): Payer: Self-pay | Admitting: Certified Registered Nurse Anesthetist

## 2022-10-29 ENCOUNTER — Encounter (HOSPITAL_COMMUNITY): Admission: EM | Disposition: E | Payer: Self-pay | Source: Home / Self Care | Attending: Family Medicine

## 2022-10-29 ENCOUNTER — Inpatient Hospital Stay (HOSPITAL_COMMUNITY): Payer: Medicare Other | Admitting: Certified Registered Nurse Anesthetist

## 2022-10-29 DIAGNOSIS — R6521 Severe sepsis with septic shock: Secondary | ICD-10-CM | POA: Diagnosis not present

## 2022-10-29 DIAGNOSIS — K56609 Unspecified intestinal obstruction, unspecified as to partial versus complete obstruction: Secondary | ICD-10-CM

## 2022-10-29 DIAGNOSIS — K55059 Acute (reversible) ischemia of intestine, part and extent unspecified: Secondary | ICD-10-CM | POA: Diagnosis not present

## 2022-10-29 DIAGNOSIS — A419 Sepsis, unspecified organism: Secondary | ICD-10-CM

## 2022-10-29 DIAGNOSIS — T68XXXA Hypothermia, initial encounter: Secondary | ICD-10-CM | POA: Diagnosis not present

## 2022-10-29 DIAGNOSIS — I509 Heart failure, unspecified: Secondary | ICD-10-CM

## 2022-10-29 DIAGNOSIS — N179 Acute kidney failure, unspecified: Secondary | ICD-10-CM

## 2022-10-29 DIAGNOSIS — I110 Hypertensive heart disease with heart failure: Secondary | ICD-10-CM | POA: Diagnosis not present

## 2022-10-29 HISTORY — PX: LAPAROTOMY: SHX154

## 2022-10-29 LAB — BLOOD GAS, ARTERIAL
Acid-base deficit: 16 mmol/L — ABNORMAL HIGH (ref 0.0–2.0)
Acid-base deficit: 11 mmol/L — ABNORMAL HIGH (ref 0.0–2.0)
Bicarbonate: 14.4 mmol/L — ABNORMAL LOW (ref 20.0–28.0)
Bicarbonate: 15.3 mmol/L — ABNORMAL LOW (ref 20.0–28.0)
Drawn by: 27021
Drawn by: 270211
FIO2: 100 %
FIO2: 100 %
MECHVT: 410 mL
MECHVT: 410 mL
O2 Saturation: 89.3 %
O2 Saturation: 95.2 %
PEEP: 10 cmH2O
PEEP: 10 cmH2O
Patient temperature: 37
Patient temperature: 36.1
RATE: 18 resp/min
RATE: 32 resp/min
pCO2 arterial: 51 mmHg — ABNORMAL HIGH (ref 32–48)
pCO2 arterial: 34 mmHg (ref 32–48)
pH, Arterial: 7.06 — CL (ref 7.35–7.45)
pH, Arterial: 7.26 — ABNORMAL LOW (ref 7.35–7.45)
pO2, Arterial: 68 mmHg — ABNORMAL LOW (ref 83–108)
pO2, Arterial: 72 mmHg — ABNORMAL LOW (ref 83–108)

## 2022-10-29 LAB — DIC (DISSEMINATED INTRAVASCULAR COAGULATION)PANEL
D-Dimer, Quant: 5.75 ug/mL-FEU — ABNORMAL HIGH (ref 0.00–0.50)
Fibrinogen: 286 mg/dL (ref 210–475)
INR: 1.9 — ABNORMAL HIGH (ref 0.8–1.2)
Platelets: 166 10*3/uL (ref 150–400)
Prothrombin Time: 21.5 seconds — ABNORMAL HIGH (ref 11.4–15.2)
Smear Review: NONE SEEN
aPTT: 35 seconds (ref 24–36)

## 2022-10-29 LAB — CBC
HCT: 52.1 % — ABNORMAL HIGH (ref 36.0–46.0)
HCT: 38.8 % (ref 36.0–46.0)
Hemoglobin: 16.7 g/dL — ABNORMAL HIGH (ref 12.0–15.0)
Hemoglobin: 12.5 g/dL (ref 12.0–15.0)
MCH: 26.9 pg (ref 26.0–34.0)
MCH: 27.2 pg (ref 26.0–34.0)
MCHC: 32.1 g/dL (ref 30.0–36.0)
MCHC: 32.2 g/dL (ref 30.0–36.0)
MCV: 84 fL (ref 80.0–100.0)
MCV: 84.3 fL (ref 80.0–100.0)
Platelets: 204 K/uL (ref 150–400)
Platelets: 167 10*3/uL (ref 150–400)
RBC: 6.2 MIL/uL — ABNORMAL HIGH (ref 3.87–5.11)
RBC: 4.6 MIL/uL (ref 3.87–5.11)
RDW: 15.9 % — ABNORMAL HIGH (ref 11.5–15.5)
RDW: 15.2 % (ref 11.5–15.5)
WBC: 5.6 K/uL (ref 4.0–10.5)
WBC: 1.9 10*3/uL — ABNORMAL LOW (ref 4.0–10.5)
nRBC: 0 % (ref 0.0–0.2)
nRBC: 0 % (ref 0.0–0.2)

## 2022-10-29 LAB — BASIC METABOLIC PANEL WITH GFR
Anion gap: 17 — ABNORMAL HIGH (ref 5–15)
BUN: 33 mg/dL — ABNORMAL HIGH (ref 8–23)
CO2: 16 mmol/L — ABNORMAL LOW (ref 22–32)
Calcium: 10.1 mg/dL (ref 8.9–10.3)
Chloride: 104 mmol/L (ref 98–111)
Creatinine, Ser: 2.37 mg/dL — ABNORMAL HIGH (ref 0.44–1.00)
GFR, Estimated: 19 mL/min — ABNORMAL LOW
Glucose, Bld: 127 mg/dL — ABNORMAL HIGH (ref 70–99)
Potassium: 3.6 mmol/L (ref 3.5–5.1)
Sodium: 137 mmol/L (ref 135–145)

## 2022-10-29 LAB — BASIC METABOLIC PANEL
Anion gap: 8 (ref 5–15)
BUN: 31 mg/dL — ABNORMAL HIGH (ref 8–23)
CO2: 17 mmol/L — ABNORMAL LOW (ref 22–32)
Calcium: 6.4 mg/dL — CL (ref 8.9–10.3)
Chloride: 107 mmol/L (ref 98–111)
Creatinine, Ser: 1.87 mg/dL — ABNORMAL HIGH (ref 0.44–1.00)
GFR, Estimated: 26 mL/min — ABNORMAL LOW (ref >60)
Glucose, Bld: 145 mg/dL — ABNORMAL HIGH (ref 70–99)
Potassium: 2.8 mmol/L — ABNORMAL LOW (ref 3.5–5.1)
Sodium: 132 mmol/L — ABNORMAL LOW (ref 135–145)

## 2022-10-29 LAB — PROTIME-INR
INR: 1.4 — ABNORMAL HIGH (ref 0.8–1.2)
Prothrombin Time: 16.8 seconds — ABNORMAL HIGH (ref 11.4–15.2)

## 2022-10-29 LAB — LACTIC ACID, PLASMA
Lactic Acid, Venous: 6.2 mmol/L (ref 0.5–1.9)
Lactic Acid, Venous: 3.3 mmol/L (ref 0.5–1.9)
Lactic Acid, Venous: 5.4 mmol/L (ref 0.5–1.9)
Lactic Acid, Venous: >9 mmol/L (ref 0.5–1.9)

## 2022-10-29 LAB — MRSA NEXT GEN BY PCR, NASAL: MRSA by PCR Next Gen: NOT DETECTED

## 2022-10-29 LAB — APTT: aPTT: 25 seconds (ref 24–36)

## 2022-10-29 SURGERY — LAPAROTOMY, EXPLORATORY
Anesthesia: General | Site: Abdomen

## 2022-10-29 MED ORDER — SODIUM BICARBONATE 8.4 % IV SOLN
INTRAVENOUS | Status: AC
  Filled 2022-10-29: qty 100

## 2022-10-29 MED ORDER — VASOPRESSIN 20 UNITS/100 ML INFUSION FOR SHOCK
0.0000 [IU]/min | INTRAVENOUS | Status: DC
  Administered 2022-10-29: 0.03 [IU]/min via INTRAVENOUS
  Filled 2022-10-29: qty 100

## 2022-10-29 MED ORDER — MIDODRINE HCL 5 MG PO TABS: 5.0000 mg | ORAL_TABLET | Freq: Once | ORAL | Status: DC

## 2022-10-29 MED ORDER — PHENYLEPHRINE HCL-NACL 20-0.9 MG/250ML-% IV SOLN: 0.0000 ug/min | INTRAVENOUS | Status: DC

## 2022-10-29 MED ORDER — KETAMINE HCL 10 MG/ML IJ SOLN
INTRAMUSCULAR | Status: DC | PRN
  Administered 2022-10-29: 20 mg via INTRAVENOUS
  Administered 2022-10-29: 30 mg via INTRAVENOUS

## 2022-10-29 MED ORDER — MIDAZOLAM HCL 2 MG/2ML IJ SOLN
1.0000 mg | INTRAMUSCULAR | Status: DC | PRN
  Administered 2022-10-29: 1 mg via INTRAVENOUS
  Filled 2022-10-29: qty 2

## 2022-10-29 MED ORDER — LIDOCAINE HCL (PF) 2 % IJ SOLN
INTRAMUSCULAR | Status: AC
  Filled 2022-10-29: qty 5

## 2022-10-29 MED ORDER — SODIUM CHLORIDE 0.9 % IV SOLN
2.0000 g | Freq: Every day | INTRAVENOUS | Status: DC
  Administered 2022-10-29: 2 g via INTRAVENOUS

## 2022-10-29 MED ORDER — SODIUM CHLORIDE 0.9 % IV BOLUS
500.0000 mL | Freq: Once | INTRAVENOUS | Status: AC
  Administered 2022-10-29: 500 mL via INTRAVENOUS

## 2022-10-29 MED ORDER — DEXAMETHASONE SODIUM PHOSPHATE 10 MG/ML IJ SOLN
INTRAMUSCULAR | Status: AC
  Filled 2022-10-29: qty 1

## 2022-10-29 MED ORDER — PHENYLEPHRINE HCL-NACL 20-0.9 MG/250ML-% IV SOLN: 25.0000 ug/min | INTRAVENOUS | Status: DC

## 2022-10-29 MED ORDER — HYDROCORTISONE SOD SUC (PF) 100 MG IJ SOLR
100.0000 mg | Freq: Three times a day (TID) | INTRAMUSCULAR | Status: DC
  Administered 2022-10-29 – 2022-10-30 (×2): 100 mg via INTRAVENOUS
  Filled 2022-10-29 (×2): qty 2

## 2022-10-29 MED ORDER — ORAL CARE MOUTH RINSE: 15.0000 mL | OROMUCOSAL | Status: DC | PRN

## 2022-10-29 MED ORDER — LACTATED RINGERS IV BOLUS
1000.0000 mL | INTRAVENOUS | Status: AC
  Administered 2022-10-29 (×2): 1000 mL via INTRAVENOUS

## 2022-10-29 MED ORDER — NOREPINEPHRINE 4 MG/250ML-% IV SOLN
0.0000 ug/min | INTRAVENOUS | Status: DC
  Administered 2022-10-29 – 2022-10-30 (×4): 40 ug/min via INTRAVENOUS
  Filled 2022-10-29 (×4): qty 250

## 2022-10-29 MED ORDER — FENTANYL BOLUS VIA INFUSION
25.0000 ug | INTRAVENOUS | Status: DC | PRN
  Administered 2022-10-29 (×2): 25 ug via INTRAVENOUS

## 2022-10-29 MED ORDER — SODIUM CHLORIDE 0.9 % IV BOLUS
1000.0000 mL | Freq: Once | INTRAVENOUS | Status: AC
  Administered 2022-10-29: 1000 mL via INTRAVENOUS

## 2022-10-29 MED ORDER — ONDANSETRON HCL 4 MG/2ML IJ SOLN
INTRAMUSCULAR | Status: DC | PRN
  Administered 2022-10-29: 4 mg via INTRAVENOUS

## 2022-10-29 MED ORDER — ROCURONIUM BROMIDE 10 MG/ML (PF) SYRINGE
PREFILLED_SYRINGE | INTRAVENOUS | Status: AC
  Filled 2022-10-29: qty 10

## 2022-10-29 MED ORDER — SODIUM CHLORIDE 0.9 % IV SOLN: 250.0000 mL | INTRAVENOUS | Status: DC

## 2022-10-29 MED ORDER — VASOPRESSIN 20 UNIT/ML IV SOLN
INTRAVENOUS | Status: DC | PRN
  Administered 2022-10-29: 2 [IU] via INTRAVENOUS

## 2022-10-29 MED ORDER — PHENYLEPHRINE HCL-NACL 20-0.9 MG/250ML-% IV SOLN
0.0000 ug/min | INTRAVENOUS | Status: DC
  Administered 2022-10-29: 130 ug/min via INTRAVENOUS
  Administered 2022-10-29: 120 ug/min via INTRAVENOUS
  Administered 2022-10-29 (×2): 130 ug/min via INTRAVENOUS
  Administered 2022-10-30: 140 ug/min via INTRAVENOUS
  Administered 2022-10-30 (×3): 500 ug/min via INTRAVENOUS
  Administered 2022-10-30: 180 ug/min via INTRAVENOUS
  Filled 2022-10-29 (×7): qty 250

## 2022-10-29 MED ORDER — ORAL CARE MOUTH RINSE
15.0000 mL | OROMUCOSAL | Status: DC
  Administered 2022-10-29 (×3): 15 mL via OROMUCOSAL

## 2022-10-29 MED ORDER — ACETAMINOPHEN 10 MG/ML IV SOLN
1000.0000 mg | Freq: Four times a day (QID) | INTRAVENOUS | Status: DC | PRN
  Filled 2022-10-29: qty 100

## 2022-10-29 MED ORDER — SODIUM CHLORIDE 0.9 % IV SOLN: 2.0000 g | Freq: Every day | INTRAVENOUS | Status: DC

## 2022-10-29 MED ORDER — PHENYLEPHRINE HCL-NACL 20-0.9 MG/250ML-% IV SOLN
INTRAVENOUS | Status: DC | PRN
  Administered 2022-10-29: 40 ug/min via INTRAVENOUS

## 2022-10-29 MED ORDER — STERILE WATER FOR INJECTION IV SOLN
INTRAVENOUS | Status: DC
  Filled 2022-10-29: qty 1000
  Filled 2022-10-29: qty 150
  Filled 2022-10-29: qty 1000
  Filled 2022-10-29: qty 150

## 2022-10-29 MED ORDER — ROCURONIUM BROMIDE 10 MG/ML (PF) SYRINGE
PREFILLED_SYRINGE | INTRAVENOUS | Status: DC | PRN
  Administered 2022-10-29 (×2): 50 mg via INTRAVENOUS

## 2022-10-29 MED ORDER — METRONIDAZOLE 500 MG/100ML IV SOLN
500.0000 mg | Freq: Two times a day (BID) | INTRAVENOUS | Status: DC
  Administered 2022-10-29 (×2): 500 mg via INTRAVENOUS
  Filled 2022-10-29 (×2): qty 100

## 2022-10-29 MED ORDER — MIDAZOLAM HCL 2 MG/2ML IJ SOLN
INTRAMUSCULAR | Status: AC
  Filled 2022-10-29: qty 2

## 2022-10-29 MED ORDER — SODIUM CHLORIDE 0.9 % IV SOLN
2.0000 g | Freq: Once | INTRAVENOUS | Status: DC
  Filled 2022-10-29: qty 12.5

## 2022-10-29 MED ORDER — ETOMIDATE 2 MG/ML IV SOLN
INTRAVENOUS | Status: AC
  Filled 2022-10-29: qty 10

## 2022-10-29 MED ORDER — ETOMIDATE 2 MG/ML IV SOLN
INTRAVENOUS | Status: DC | PRN
  Administered 2022-10-29: 12 mg via INTRAVENOUS

## 2022-10-29 MED ORDER — FENTANYL 2500MCG IN NS 250ML (10MCG/ML) PREMIX INFUSION
25.0000 ug/h | INTRAVENOUS | Status: DC
  Administered 2022-10-29: 25 ug/h via INTRAVENOUS
  Administered 2022-10-29: 100 ug/h via INTRAVENOUS
  Filled 2022-10-29: qty 250

## 2022-10-29 MED ORDER — CHLORHEXIDINE GLUCONATE CLOTH 2 % EX PADS: 6.0000 | MEDICATED_PAD | Freq: Every day | CUTANEOUS | Status: DC

## 2022-10-29 MED ORDER — VASOPRESSIN 20 UNITS/100 ML INFUSION FOR SHOCK
INTRAVENOUS | Status: AC
  Administered 2022-10-29: 0.03 [IU]/min via INTRAVENOUS
  Filled 2022-10-29: qty 100

## 2022-10-29 MED ORDER — PROPOFOL 10 MG/ML IV BOLUS
INTRAVENOUS | Status: AC
  Filled 2022-10-29: qty 20

## 2022-10-29 MED ORDER — FENTANYL CITRATE PF 50 MCG/ML IJ SOSY: 25.0000 ug | PREFILLED_SYRINGE | Freq: Once | INTRAMUSCULAR | Status: DC

## 2022-10-29 MED ORDER — PHENYLEPHRINE 80 MCG/ML (10ML) SYRINGE FOR IV PUSH (FOR BLOOD PRESSURE SUPPORT)
PREFILLED_SYRINGE | INTRAVENOUS | Status: DC | PRN
  Administered 2022-10-29 (×2): 80 ug via INTRAVENOUS
  Administered 2022-10-29 (×2): 160 ug via INTRAVENOUS

## 2022-10-29 MED ORDER — SODIUM BICARBONATE 8.4 % IV SOLN
100.0000 meq | Freq: Once | INTRAVENOUS | Status: AC
  Administered 2022-10-29: 100 meq via INTRAVENOUS
  Filled 2022-10-29: qty 50

## 2022-10-29 MED ORDER — PHENYLEPHRINE 80 MCG/ML (10ML) SYRINGE FOR IV PUSH (FOR BLOOD PRESSURE SUPPORT)
PREFILLED_SYRINGE | INTRAVENOUS | Status: AC
  Filled 2022-10-29: qty 10

## 2022-10-29 MED ORDER — SUCCINYLCHOLINE CHLORIDE 200 MG/10ML IV SOSY
PREFILLED_SYRINGE | INTRAVENOUS | Status: AC
  Filled 2022-10-29: qty 10

## 2022-10-29 MED ORDER — SODIUM BICARBONATE 8.4 % IV SOLN
INTRAVENOUS | Status: AC
  Filled 2022-10-29: qty 50

## 2022-10-29 MED ORDER — FENTANYL CITRATE (PF) 100 MCG/2ML IJ SOLN
INTRAMUSCULAR | Status: DC | PRN
  Administered 2022-10-29 (×2): 50 ug via INTRAVENOUS

## 2022-10-29 MED ORDER — CALCIUM GLUCONATE-NACL 1-0.675 GM/50ML-% IV SOLN
1.0000 g | Freq: Once | INTRAVENOUS | Status: AC
  Administered 2022-10-29: 1000 mg via INTRAVENOUS
  Filled 2022-10-29: qty 50

## 2022-10-29 MED ORDER — SODIUM CHLORIDE 0.9% FLUSH: 10.0000 mL | INTRAVENOUS | Status: DC | PRN

## 2022-10-29 MED ORDER — PHENYLEPHRINE HCL-NACL 20-0.9 MG/250ML-% IV SOLN
25.0000 ug/min | INTRAVENOUS | Status: DC
  Administered 2022-10-29: 200 ug/min via INTRAVENOUS
  Filled 2022-10-29 (×3): qty 250

## 2022-10-29 MED ORDER — PHENYLEPHRINE HCL-NACL 20-0.9 MG/250ML-% IV SOLN
INTRAVENOUS | Status: AC
  Administered 2022-10-29: 240 ug/min via INTRAVENOUS
  Filled 2022-10-29: qty 250

## 2022-10-29 MED ORDER — EPHEDRINE SULFATE-NACL 50-0.9 MG/10ML-% IV SOSY
PREFILLED_SYRINGE | INTRAVENOUS | Status: DC | PRN
  Administered 2022-10-29: 10 mg via INTRAVENOUS

## 2022-10-29 MED ORDER — SUCCINYLCHOLINE CHLORIDE 200 MG/10ML IV SOSY
PREFILLED_SYRINGE | INTRAVENOUS | Status: DC | PRN
  Administered 2022-10-29: 120 mg via INTRAVENOUS

## 2022-10-29 MED ORDER — KETAMINE HCL 50 MG/5ML IJ SOSY
PREFILLED_SYRINGE | INTRAMUSCULAR | Status: AC
  Filled 2022-10-29: qty 5

## 2022-10-29 MED ORDER — 0.9 % SODIUM CHLORIDE (POUR BTL) OPTIME
TOPICAL | Status: DC | PRN
  Administered 2022-10-29: 2000 mL

## 2022-10-29 MED ORDER — FENTANYL CITRATE (PF) 100 MCG/2ML IJ SOLN
INTRAMUSCULAR | Status: AC
  Filled 2022-10-29: qty 2

## 2022-10-29 MED ORDER — SODIUM CHLORIDE 0.9% FLUSH
10.0000 mL | Freq: Two times a day (BID) | INTRAVENOUS | Status: DC
  Administered 2022-10-29: 40 mL
  Administered 2022-10-29: 10 mL

## 2022-10-29 MED ORDER — DIATRIZOATE MEGLUMINE & SODIUM 66-10 % PO SOLN
90.0000 mL | Freq: Once | ORAL | Status: DC
  Filled 2022-10-29: qty 90

## 2022-10-29 MED ORDER — PANTOPRAZOLE SODIUM 40 MG IV SOLR
40.0000 mg | Freq: Two times a day (BID) | INTRAVENOUS | Status: DC
  Administered 2022-10-29 (×2): 40 mg via INTRAVENOUS
  Filled 2022-10-29 (×2): qty 10

## 2022-10-29 MED ORDER — LEVOTHYROXINE SODIUM 100 MCG/5ML IV SOLN: 25.0000 ug | Freq: Every day | INTRAVENOUS | Status: DC

## 2022-10-29 MED ORDER — NOREPINEPHRINE 4 MG/250ML-% IV SOLN
2.0000 ug/min | INTRAVENOUS | Status: DC
  Administered 2022-10-29: 20 ug/kg/min via INTRAVENOUS
  Administered 2022-10-29: 20 ug/min via INTRAVENOUS
  Filled 2022-10-29 (×3): qty 250

## 2022-10-29 MED ORDER — ENOXAPARIN SODIUM 30 MG/0.3ML IJ SOSY
30.0000 mg | PREFILLED_SYRINGE | Freq: Every day | INTRAMUSCULAR | Status: DC
  Administered 2022-10-29: 30 mg via SUBCUTANEOUS
  Filled 2022-10-29: qty 0.3

## 2022-10-29 MED ORDER — VASOPRESSIN 20 UNIT/ML IV SOLN
INTRAVENOUS | Status: AC
  Filled 2022-10-29: qty 1

## 2022-10-29 MED ORDER — POTASSIUM CHLORIDE 10 MEQ/50ML IV SOLN
10.0000 meq | INTRAVENOUS | Status: AC
  Administered 2022-10-29 (×4): 10 meq via INTRAVENOUS
  Filled 2022-10-29 (×4): qty 50

## 2022-10-29 MED ORDER — NOREPINEPHRINE 4 MG/250ML-% IV SOLN
INTRAVENOUS | Status: AC
  Administered 2022-10-29: 34 ug/min via INTRAVENOUS
  Filled 2022-10-29: qty 250

## 2022-10-29 MED ORDER — MIDAZOLAM HCL 2 MG/2ML IJ SOLN
INTRAMUSCULAR | Status: DC | PRN
  Administered 2022-10-29: 2 mg via INTRAVENOUS

## 2022-10-29 MED ORDER — SODIUM CHLORIDE 0.9 % IV BOLUS (SEPSIS)
1250.0000 mL | Freq: Once | INTRAVENOUS | Status: AC
  Administered 2022-10-29: 1250 mL via INTRAVENOUS

## 2022-10-29 MED ORDER — ONDANSETRON HCL 4 MG/2ML IJ SOLN
INTRAMUSCULAR | Status: AC
  Filled 2022-10-29: qty 2

## 2022-10-29 SURGICAL SUPPLY — 42 items
APL PRP STRL LF DISP 70% ISPRP (MISCELLANEOUS)
BAG COUNTER SPONGE SURGICOUNT (BAG) IMPLANT
BAG SPNG CNTER NS LX DISP (BAG)
CANISTER WOUND CARE 500ML ATS (WOUND CARE) IMPLANT
CHLORAPREP W/TINT 26 (MISCELLANEOUS) IMPLANT
COVER MAYO STAND STRL (DRAPES) ×1 IMPLANT
COVER SURGICAL LIGHT HANDLE (MISCELLANEOUS) ×1 IMPLANT
DRAIN CHANNEL 19F RND (DRAIN) IMPLANT
DRAPE LAPAROSCOPIC ABDOMINAL (DRAPES) ×1 IMPLANT
DRSG OPSITE POSTOP 4X10 (GAUZE/BANDAGES/DRESSINGS) IMPLANT
DRSG OPSITE POSTOP 4X6 (GAUZE/BANDAGES/DRESSINGS) IMPLANT
DRSG OPSITE POSTOP 4X8 (GAUZE/BANDAGES/DRESSINGS) IMPLANT
ELECT REM PT RETURN 15FT ADLT (MISCELLANEOUS) ×1 IMPLANT
EVACUATOR SILICONE 100CC (DRAIN) IMPLANT
GLOVE BIO SURGEON STRL SZ 6 (GLOVE) ×2 IMPLANT
GLOVE INDICATOR 6.5 STRL GRN (GLOVE) ×2 IMPLANT
GLOVE SS BIOGEL STRL SZ 6 (GLOVE) ×1 IMPLANT
GOWN STRL REUS W/ TWL LRG LVL3 (GOWN DISPOSABLE) ×1 IMPLANT
GOWN STRL REUS W/ TWL XL LVL3 (GOWN DISPOSABLE) IMPLANT
GOWN STRL REUS W/TWL LRG LVL3 (GOWN DISPOSABLE) ×1
GOWN STRL REUS W/TWL XL LVL3 (GOWN DISPOSABLE)
KIT TURNOVER KIT A (KITS) IMPLANT
LIGASURE IMPACT 36 18CM CVD LR (INSTRUMENTS) IMPLANT
PACK GENERAL/GYN (CUSTOM PROCEDURE TRAY) ×1 IMPLANT
RELOAD PROXIMATE 75MM BLUE (ENDOMECHANICALS) ×2 IMPLANT
RELOAD STAPLE 75 3.8 BLU REG (ENDOMECHANICALS) IMPLANT
SPONGE ABD ABTHERA ADVANCE (MISCELLANEOUS) IMPLANT
STAPLER PROXIMATE 75MM BLUE (STAPLE) IMPLANT
STAPLER VISISTAT 35W (STAPLE) IMPLANT
SUT ETHILON 3 0 PS 1 (SUTURE) IMPLANT
SUT MNCRL AB 4-0 PS2 18 (SUTURE) IMPLANT
SUT NOVA T20/GS 25 (SUTURE) IMPLANT
SUT SILK 2 0 (SUTURE) ×1
SUT SILK 2 0 SH CR/8 (SUTURE) ×1 IMPLANT
SUT SILK 2-0 18XBRD TIE 12 (SUTURE) ×1 IMPLANT
SUT SILK 3 0 (SUTURE) ×1
SUT SILK 3 0 SH CR/8 (SUTURE) ×1 IMPLANT
SUT SILK 3-0 18XBRD TIE 12 (SUTURE) ×1 IMPLANT
TOWEL OR 17X26 10 PK STRL BLUE (TOWEL DISPOSABLE) IMPLANT
TOWEL OR NON WOVEN STRL DISP B (DISPOSABLE) ×1 IMPLANT
TRAY FOLEY MTR SLVR 14FR STAT (SET/KITS/TRAYS/PACK) ×1 IMPLANT
TRAY FOLEY MTR SLVR 16FR STAT (SET/KITS/TRAYS/PACK) ×1 IMPLANT

## 2022-10-29 NOTE — Op Note (Signed)
Operative Note  Elizabeth Shields  [id number]  [id number]  10/05/2022   Surgeon: Romana Juniper MD FACS   Procedure performed: Exploratory laparotomy, small bowel resection, placement of ABThera wound VAC   Preop diagnosis: Ischemic bowel and small bowel obstruction Post-op diagnosis/intraop findings: Same   Specimens: Small bowel resection, approximately 30 cm segment Retained items: ABThera wound VAC EBL: Minimal cc Complications: none  Return to the OR anticipated in 24 to 48 hours for second look.  Bowel is in discontinuity.   Description of procedure: After obtaining informed consent the patient was taken to the operating room and placed supine on operating room table where general endotracheal anesthesia was initiated, preoperative antibiotics were administered, SCDs applied, and a formal timeout was performed.  Of note, on induction emesis was noted but minimal or no cord penetration was noted by the anesthetist.  An NG tube placed shortly after intubation yielded 1600 of output initially.  Placement was ultimately confirmed intraoperatively.  The abdomen was prepped and draped in usual sterile fashion.  A vertical midline laparotomy was created and upon entry to the peritoneal cavity blood-tinged ascites was encountered along with frankly ischemic bowel.  There was a single band adhesion of omentum going towards the right lower quadrant as well as omental adhesions along the abdominal wall in the left upper quadrant.  These were all taken down and then we were able to eviscerate the small bowel.  Beginning about 40 cm from the ligament of Treitz are areas of patchy ischemia versus small bowel wall contusion.  About 100 cm distal from the ligament of Treitz is an area of frank ischemia without overt necrosis.  Transition point to healthy viable appearing bowel is approximately 160 cm from the ileocecal valve.  Once the obstruction had been relieved, small bowel contents were milked  proximally towards the stomach to be decompressed by the NG tube which was confirmed to be in appropriate position.  The small bowel was then returned to the abdominal cavity and left alone for about 10 minutes in order to allow for any reperfusion that may occur.  The bowel was then reinspected.  The patchy areas of ischemia along the proximal small bowel did appear slightly better though not completely resolved in the bowel surrounding this did appear viable.  The more frankly ischemic appearing bowel distally did not look significantly better.  This was resected, approximately 30 cm segment, using serial fires of the blue load 75 mm Endo GIA stapler.  The intervening mesentery was taken down with the LigaSure.  At this point the patient is on significant pressor support and the proximal bowel remains borderline and therefore anastomosis was deferred and the abdomen will be left open for planned second look laparotomy.  Hemostasis was excellent.  The bowel was then returned to the abdominal cavity and an ABThera wound VAC system placed.  The patient was returned to the ICU in critical condition.    All counts were correct at the completion of the case.

## 2022-10-29 NOTE — Anesthesia Preprocedure Evaluation (Addendum)
Anesthesia Evaluation  Patient identified by MRN, date of birth, ID band Patient awake    Reviewed: Allergy & Precautions, H&P , NPO status , Patient's Chart, lab work & pertinent test results, reviewed documented beta blocker date and time   Airway Mallampati: II       Dental  (+) Edentulous Upper, Edentulous Lower   Pulmonary sleep apnea and Continuous Positive Airway Pressure Ventilation    Pulmonary exam normal        Cardiovascular hypertension, Pt. on medications and Pt. on home beta blockers +CHF  Normal cardiovascular exam     Neuro/Psych  PSYCHIATRIC DISORDERS Anxiety        GI/Hepatic ,GERD  Medicated,,  Endo/Other  Hypothyroidism    Renal/GU      Musculoskeletal   Abdominal   Peds  Hematology   Anesthesia Other Findings tudy Conclusions   - Left ventricle: The cavity size was normal. There was mild    asymmetric hypertrophy of the septum, consistent with    hypertrophic cardiomyopathy. Systolic function was    vigorous. The estimated ejection fraction was in the range    of 65% to 70%. There was dynamic obstruction at restin the    outflow tract, with a peak velocity of 500cm/sec and a    peak gradient of 129mm Hg. Wall motion was normal; there    were no regional wall motion abnormalities. Doppler    parameters are consistent with abnormal left ventricular    relaxation (grade 1 diastolic dysfunction). Doppler    parameters are consistent with elevated mean left atrial    filling pressure.  - Aortic valve: Mid systolic pre-closure was observed,    consistent with dynamic subvalvular stenosis.  - Mitral valve: Calcified annulus. There was severe systolic    anterior motion of the anterior leaflet. Mild to moderate    mid-to-late systolic regurgitation directed eccentrically    and posteriorly, due to anterior leaflet systolic anterior    motion.  - Left atrium: The atrium was mildly dilated.  -  Atrial septum: No defect or patent foramen ovale was    identified.  - Pulmonary arteries: Systolic pressure was moderately    increased. PA peak pressure: 28mm Hg (S).  Impressions:   - Findings are consistent with hypertrophic obstructive    cardiomyopathy with severe LV outflow obstruction at rest.  Transthoracic echocardiography.  M-mode, complete 2D,  spectral Doppler, and color Doppler.  Height:  Height:  160cm. Height: 63in.  Weight:  Weight: 78.2kg. Weight:  172lb.  Body mass index:  BMI: 30.5kg/m^2.  Body surface  area:    BSA: 1.69m^2.  Blood pressure:     128/68.  Patient  status:  Inpatient.  Location:  [facility] laboratory.   ------------------------------------------------------------   ------------------------------------------------------------  Left ventricle:  The cavity size was normal. There was mild  asymmetric hypertrophy of the septum, consistent with  hyp    Reproductive/Obstetrics                             Anesthesia Physical Anesthesia Plan  ASA: III  Anesthesia Plan: General   Post-op Pain Management:    Induction: Intravenous, Rapid sequence and Cricoid pressure planned  PONV Risk Score and Plan: 3 and Treatment may vary due to age or medical condition  Airway Management Planned: Oral ETT  Additional Equipment: None  Intra-op Plan:   Post-operative Plan: Extubation in OR  Informed Consent: I have reviewed the patients  History and Physical, chart, labs and discussed the procedure including the risks, benefits and alternatives for the proposed anesthesia with the patient or authorized representative who has indicated his/her understanding and acceptance.     Dental advisory given  Plan Discussed with: CRNA  Anesthesia Plan Comments:         Anesthesia Quick Evaluation

## 2022-10-29 NOTE — Progress Notes (Addendum)
Patient acutely changed earlier this morning with hypotension, hypothermia, following multiple episodes of emesis overnight.  Reports some abdominal soreness.  Hypotension persisting despite fluid boluses.  She is mentating appropriately.  Abrupt change in her labs including marked increase in creatinine from 0.84 to 2.37, bicarb has dropped from 23 to 16.  White count is 5.6 from 7.1, but hemoglobin has risen to 16.7 from 14 suggesting intravascular hemoconcentration likely from third spacing; she may actually be leukopenic.  Lactate is pending.  Plain films show worsened dilation of small bowel loops in the right hemiabdomen.  Abdomen is more distended, more firm, diffusely mildly tender.    Overall picture is very concerning for ischemic bowel.  No other obvious source although given episodes of emesis overnight it is possible she aspirated; but lungs are clear at this point.  I agree with recommendation for NG tube decompression and initiation of broad-spectrum antibiotics.  I recommend proceeding urgently to the operating room for exploratory laparotomy with possible bowel resection.  Given hypotension and overall status she may be left in discontinuity with an open abdomen and plans for second look depending on intraoperative course.  She has a very high risk of morbidity and mortality from this situation and I went over risks of bleeding, infection, pain, scarring, injury to intra-abdominal structures, anastomotic leak/stricture, intra-abdominal abscess, ileus, obstruction, wound healing problems/hernia/fascial dehiscence, pulmonary/cardiovascular/thromboembolic risks of undergoing general anesthesia.  I feel that her risk fromsurgery is outweighed by risk of progressive sepsis/shock from untreated ischemic bowel that she will not be able to recover from. I discussed all of this with the patient and multiple family members at the bedside as well as with Dr. Sarajane Jews.  They are in agreement with the  plan.  Will proceed urgently to the operating room once the OR is available.

## 2022-10-29 NOTE — Procedures (Signed)
Arterial Catheter Insertion Procedure Note  Elizabeth Shields  [id number]  02-May-1932  Date:10/05/2022  Time:3:07 PM    Provider Performing: Collene Gobble    Procedure: Insertion of Arterial Line [phone]) with US guidance [id number])   Indication(s) Blood pressure monitoring and/or need for frequent ABGs  Consent Risks of the procedure as well as the alternatives and risks of each were explained to the patient and/or caregiver.  Consent for the procedure was obtained and is signed in the bedside chart  Anesthesia None   Time Out Verified patient identification, verified procedure, site/side was marked, verified correct patient position, special equipment/implants available, medications/allergies/relevant history reviewed, required imaging and test results available.   Sterile Technique Maximal sterile technique including full sterile barrier drape, hand hygiene, sterile gown, sterile gloves, mask, hair covering, sterile ultrasound probe cover (if used).   Procedure Description Area of catheter insertion was cleaned with chlorhexidine and draped in sterile fashion. With real-time ultrasound guidance an arterial catheter was placed into the right femoral artery.  Appropriate arterial tracings confirmed on monitor.     Complications/Tolerance None; patient tolerated the procedure well.   EBL Minimal   Specimen(s) None  Baltazar Apo, MD, PhD 10/07/2022, 3:08 PM [facility] [phone] or if no answer before 7:00PM call [phone] For any issues after 7:00PM please call [facility]Link [phone]

## 2022-10-29 NOTE — Anesthesia Procedure Notes (Signed)
Procedure Name: Intubation Date/Time: 10/10/2022 10:25 AM  Performed by: Claudia Desanctis, CRNAPre-anesthesia Checklist: Patient identified, Emergency Drugs available, Suction available and Patient being monitored Patient Re-evaluated:Patient Re-evaluated prior to induction Oxygen Delivery Method: Circle system utilized Preoxygenation: Pre-oxygenation with 100% oxygen Induction Type: IV induction, Cricoid Pressure applied and Rapid sequence Laryngoscope Size: 2 and Miller Grade View: Grade I Tube type: Oral Tube size: 7.0 mm Number of attempts: 1 Airway Equipment and Method: Stylet Placement Confirmation: ETT inserted through vocal cords under direct vision, positive ETCO2 and breath sounds checked- equal and bilateral Secured at: 21 cm Tube secured with: Tape Dental Injury: Teeth and Oropharynx as per pre-operative assessment

## 2022-10-29 NOTE — Progress Notes (Addendum)
Peripherally Inserted Central Catheter Placement  The IV Nurse has discussed with the patient and/or persons authorized to consent for the patient, the purpose of this procedure and the potential benefits and risks involved with this procedure.  The benefits include less needle sticks, lab draws from the catheter, and the patient may be discharged home with the catheter. Risks include, but not limited to, infection, bleeding, blood clot (thrombus formation), and puncture of an artery; nerve damage and irregular heartbeat and possibility to perform a PICC exchange if needed/ordered by physician.  Alternatives to this procedure were also discussed.  Bard Power PICC patient education guide, fact sheet on infection prevention and patient information card has been provided to patient /or left at bedside.  Consent obtained from daughter, Mateo Flow, in waiting room due to post surgical sedation.  PICC Placement Documentation  PICC Triple Lumen 10/22/2022 Right Brachial 38 cm 0 cm (Active)  Indication for Insertion or Continuance of Line Vasoactive infusions 10/26/2022 1252  Exposed Catheter (cm) 0 cm 10/01/2022 1252  Site Assessment Clean, Dry, Intact 10/15/2022 1252  Lumen #1 Status Flushed;Saline locked;Blood return noted 10/05/2022 1252  Lumen #2 Status Flushed;Saline locked;Blood return noted 10/22/2022 1252  Lumen #3 Status Saline locked;Flushed;Blood return noted 09/30/2022 1252  Dressing Type Transparent;Securing device 10/23/2022 1252  Dressing Status Antimicrobial disc in place;Clean, Dry, Intact 09/30/2022 1252  Safety Lock Not Applicable [id number]  Line Care Connections checked and tightened 10/17/2022 1252  Line Adjustment (NICU/IV Team Only) No 10/13/2022 1252  Dressing Intervention New dressing 10/19/2022 1252  Dressing Change Due 11/05/22 10/26/2022 [place]       Rolena Infante 09/30/2022, 12:52 PM

## 2022-10-29 NOTE — Progress Notes (Signed)
MD Sarajane Jews was notified of patient's BP and rapid response was called to come assess patient.

## 2022-10-29 NOTE — Progress Notes (Signed)
Patient was transferred to step-down and [place], RN was given report.

## 2022-10-29 NOTE — Progress Notes (Signed)
Progress Note   Patient: Elizabeth Shields [id number] DOB: November 15, 1931 DOA: 10/26/2022     2 DOS: the patient was seen and examined on 10/16/2022   Brief hospital course: 89 year old woman presented with nausea and vomiting.  Admitted for partial small bowel obstruction. 3/28: Admit 3/29: Stable, conservative management per surgery 3/30: hypotensive, hypothermic, transferred to SDU  Consultants General surgery   Procedures  Assessment and Plan: Small bowel obstruction Vomited overnight several times; AXR shows increased small bowel distension. Will place NGT.  Hypothermic. Normal WBC. Appears clinically well. Exam benign. Check stat lactic acid.  Discussed with Dr. Rosendo Gros at room. Discussed with Dr. Amado Coe. Plan for urgent surgery.  Hypotension, hypothermia, concern for shock, cannot rule out sepsis at this time AKI with AG metabolic acidosis Normal WBC. Clinically appears well, but generally cool to touch, tachypneic. Mentating normally. Exam overall benign.  Bolused IVF. BP better w/ SBP over 100 but back down to 50s, confirmed with manual. Concern for abdominal source or ischemia. Trend BMP. Check lactic acid. Etiology of AG metabolic acidosis is likely lactic acidosis. Initial sepsis protocol pending further workup.  Discussed with Dr. Lamonte Sakai, critical care consult requested. High risk for decompensation.   A-fib, not on anticoagulation Currently in SR.  Stop diltiazem, metoprolol.   Chronic diastolic CHF Appears dry. Hold Lasix for now. Monitor volume status   Hypothyroidism Change levothyroxine to IV   Elevated lipase. Clinically insignificant.  CT without any evidence to suggest pancreatitis.   Hyponatremia  Resolved.  Probably secondary to the poor oral intake.  IV fluids.     Hypercalcemia ruled out.   Initial elevated value secondary to hemoconcentration.   Generalized anxiety continue home Xanax  No POA. 2 daughters, son at bedside report patient is  DNR. Code status updated. Family ok with pressors.  Critical care consulted.      Subjective:  Hypotensive this early AM, did not respond to bolus, was transferred to SDU w/ bolus, noted to be hypothermic. Vomited several times overnight.  Patient denies pain. Breathing fine. No complaints.  Physical Exam: Vitals:   10/12/2022 0629 10/28/2022 0717 10/01/2022 0718 10/25/2022 0746  BP: (!) 63/45 (!) 66/38 (!) 63/52 106/72  Pulse: 89  82   Resp:      Temp:    (!) 96.2 F (35.7 C)  TempSrc:    Axillary  SpO2:   (!) 87%   Weight:      Height:       Physical Exam Vitals reviewed.  Constitutional:      General: She is not in acute distress.    Appearance: She is not ill-appearing or toxic-appearing.     Comments: Lying flat in bed, appears calm, comfortable  Cardiovascular:     Rate and Rhythm: Normal rate and regular rhythm.     Heart sounds: No murmur heard. Pulmonary:     Effort: Pulmonary effort is normal. No respiratory distress.     Breath sounds: No wheezing, rhonchi or rales.     Comments: Tachynepic  Abdominal:     General: Abdomen is flat. There is no distension.     Palpations: Abdomen is soft.     Tenderness: There is no abdominal tenderness. There is no guarding.  Musculoskeletal:     Right lower leg: No edema.     Left lower leg: No edema.  Skin:    Comments: Skin is cool  Neurological:     Mental Status: She is alert.     Comments: Speech  fluent and clear  Psychiatric:        Mood and Affect: Mood normal.        Behavior: Behavior normal.     Data Reviewed: Creatinine 0.84 > 2.37 BUN 15 > 33 CO2 23 > 16 AXR shows increased small bowel distension  Family Communication: two daughters, son at bedside  Disposition: Status is: Inpatient Remains inpatient appropriate because: SBO  Planned Discharge Destination:  [facility]    Time spent: 35 minutes  Author: Murray Hodgkins, MD 10/05/2022 7:56 AM  For on call review [email or web address].

## 2022-10-29 NOTE — Sepsis Progress Note (Signed)
Sepsis protocol is being followed by e[facility]. 

## 2022-10-29 NOTE — Consult Note (Addendum)
NAME:  Elizabeth Shields, MRN:  [id number], DOB:  [birth year], LOS: 2 ADMISSION DATE:  10/24/2022, CONSULTATION DATE: 10/03/2022 REFERRING MD: Dr. Sarajane Jews, CHIEF COMPLAINT: Shock  History of Present Illness:  89 year old woman with history of hypertension and diastolic dysfunction, HOCM, hypothyroidism, OSA, borderline diabetes.  She is admitted with a partial small bowel obstruction 3/28.  Treated conservatively and initially stable but has developed increased abdominal pain, hypothermia and hypotension on 3/30.  Imaging with increased small bowel distention.  IV fluid resuscitation initiated with only transient response in her blood pressure.  She moved to the ICU 3/30 for further care.  General surgery has been involved in her care, evaluated this morning and are recommending exploratory laparotomy.  Pertinent  Medical History   Past Medical History:  Diagnosis Date   Anxiety    Arthritis    Borderline diabetes    Diastolic CHF ([place]) [id number]   GERD (gastroesophageal reflux disease)    HOCM (hypertrophic obstructive cardiomyopathy) ([place])    a. Dx 2011 by Dr. Elsie Stain [facility];  b. 10/2012 Echo: EF 65-70% w/ dynamic obstruction @ rest in the outflow tract w/ peak velocity of 500 cm/sec & peak gradient of 138mmHg, nl wall motion, Gr 1 DD, sev SAM of the ant leaflet, mild to mod MR,pasp 46mmHg.    Hypertension    Hypothyroidism    Sleep apnea    Thyroid disease     Significant Hospital Events: Including procedures, antibiotic start and stop dates in addition to other pertinent events   CT chest/abdomen/pelvis 10/10/2022 > short-segment dilated loop ileum in the right lower quadrant, some caliber change in adjacent small bowel loops with stranding.  No pneumatosis or free air. Abdominal x-ray 3/30 > increased gaseous distention of small bowel loops in the right abdomen 3/30 to the ICU with shock, acute renal failure, metabolic acidosis [id number] to the OR for  exploratory laparotomy  Interim History / Subjective:  Denies any dyspnea.  Does have some moderate abdominal discomfort  Objective   Blood pressure (!) 64/40, pulse 81, temperature (!) 95.9 F (35.5 C), temperature source Axillary, resp. rate (!) 28, height 5\' 3"  (1.6 m), weight 73.3 kg, SpO2 91 %.        Intake/Output Summary (Last 24 hours) at 10/11/2022 1001 Last data filed at 10/05/2022 0800 Gross per 24 hour  Intake 4044.99 ml  Output 650 ml  Net 3394.99 ml   Filed Weights   10/10/2022 1041  Weight: 73.3 kg    Examination: General: Elderly woman laying in bed in no distress on room air HENT: Oropharynx clear, no secretions, no stridor Lungs: Decreased to both bases but otherwise clear Cardiovascular: Distant, regular, no murmur Abdomen: Somewhat distended, minimal tenderness, no bowel sounds heard Extremities: No edema Neuro: Awake, alert, interacting appropriately, follows commands GU: Deferred  Resolved Hospital Problem list     Assessment & Plan:  Shock, consider multifactorial but most concerning for septic shock due to possible bowel ischemia.  Likely also contribution of hypovolemic shock -Agree with aggressive volume resuscitation -Lactic acid now and follow trend -Cefepime + flagyl started empirically 3/30 -Norepinephrine via peripheral IV as we work to volume resuscitate and correct underlying source  Small bowel obstruction with progressive distention, concern for possible bowel ischemia with shock as above -NG tube for decompression placed -Appreciate CCS evaluation.  Have discussed with family and patient and planning to go to the OR for exploratory laparotomy this morning  Acute renal failure due  to ATN, septic shock and hypoperfusion Associated metabolic acidosis -Support blood pressure with IV fluids, pressors -Follow urine output, follow BMP -Follow lactic acid trend -She would be a poor candidate for hemodialysis if there is not a quick renal  recovery  Paroxysmal atrial fibrillation, not on home anticoagulation -Currently sinus rhythm -Diltiazem and metoprolol discontinued given shock state  HOCM, hypertension with chronic diastolic dysfunction -Lasix held -Antihypertensives held for now  Hypothyroidism -Levothyroxine changed to IV on 3/30  Best Practice (right click and "Reselect all SmartList Selections" daily)   Diet/type: NPO DVT prophylaxis: SCD GI prophylaxis: PPI Lines: N/A Foley:  N/A Code Status:  DNR Last date of multidisciplinary goals of care discussion [discussed with the patient and family at bedside 3/30. I have explained to the patient and family that she may come out of the OR critically ill, on pressors, potentially even mechanically ventilated.  They are willing to accept short-term critical care support to get over what is hopefully an acute reversible event.  They are clear that she would not want prolonged extraordinary support and that her CODE STATUS will transition to DNR postoperatively.  I will revisit with them after surgery, clarify specific goals and wishes]  Labs   CBC: Recent Labs  Lab 10/14/2022 1120 10/11/2022 0504  WBC 7.1 5.6  HGB 14.0 16.7*  HCT 42.4 52.1*  MCV 81.9 84.0  PLT 242 0000000    Basic Metabolic Panel: Recent Labs  Lab 10/02/2022 1120 10/28/22 0422 10/09/2022 0504  NA 132* 129* 137  K 4.5 3.7 3.6  CL 99 95* 104  CO2 23 23 16*  GLUCOSE 136* 156* 127*  BUN 19 15 33*  CREATININE 0.91 0.84 2.37*  CALCIUM 11.0* 10.1 10.1   GFR: Estimated Creatinine Clearance: 15.7 mL/min (A) (by C-G formula based on SCr of 2.37 mg/dL (H)). Recent Labs  Lab 10/20/2022 1120 10/13/2022 0504  WBC 7.1 5.6    Liver Function Tests: Recent Labs  Lab 10/12/2022 1120 10/28/22 0422  AST 20 22  ALT 12 15  ALKPHOS 123 119  BILITOT 0.6 1.0  PROT 8.7* 8.1  ALBUMIN 4.6 4.3   Recent Labs  Lab 10/11/2022 1120  LIPASE 70*   No results for input(s): "AMMONIA" in the last 168 hours.  ABG No  results found for: "PHART", "PCO2ART", "PO2ART", "HCO3", "TCO2", "ACIDBASEDEF", "O2SAT"   Coagulation Profile: No results for input(s): "INR", "PROTIME" in the last 168 hours.  Cardiac Enzymes: No results for input(s): "CKTOTAL", "CKMB", "CKMBINDEX", "TROPONINI" in the last 168 hours.  HbA1C: No results found for: "HGBA1C"  CBG: No results for input(s): "GLUCAP" in the last 168 hours.  Review of Systems:   As per HPI  Past Medical History:  She,  has a past medical history of Anxiety, Arthritis, Borderline diabetes, Diastolic CHF ([place]) (123XX123), GERD (gastroesophageal reflux disease), HOCM (hypertrophic obstructive cardiomyopathy) ([place]), Hypertension, Hypothyroidism, Sleep apnea, and Thyroid disease.   Surgical History:   Past Surgical History:  Procedure Laterality Date   CHOLECYSTECTOMY N/A 05/20/2013   Procedure: LAPAROSCOPIC CHOLECYSTECTOMY WITH INTRAOPERATIVE CHOLANGIOGRAM;  Surgeon: Edward Jolly, MD;  Location: [place];  Service: General;  Laterality: N/A;   TUBAL LIGATION       Social History:   reports that she has never smoked. She has never used smokeless tobacco. She reports that she does not drink alcohol and does not use drugs.   Family History:  Her family history includes Heart disease in her unknown relative.   Allergies Allergies  Allergen  Reactions   Sulfa Antibiotics Nausea And Vomiting     Home Medications  Prior to Admission medications   Medication Sig Start Date End Date Taking? Authorizing Provider  ACETAMINOPHEN PO Take 1,300 mg by mouth every 8 (eight) hours as needed for pain.   Yes [provider]  ALPRAZolam (XANAX) 0.5 MG tablet 0.5 mg 2 (two) times daily.   Yes [provider]  aspirin 81 MG tablet Take 81 mg by mouth daily.   Yes [provider]  dicyclomine (BENTYL) 10 MG capsule Take 10 mg by mouth 2 (two) times daily.   Yes [provider]  diltiazem (DILACOR XR) 240 MG 24 hr capsule Take  240 mg by mouth daily.   Yes [provider]  doxycycline (VIBRAMYCIN) 100 MG capsule Take 1 capsule (100 mg total) by mouth 2 (two) times daily. 03/30/18  Yes Petrucelli, Samantha R, PA-C  furosemide (LASIX) 20 MG tablet Take 20 mg by mouth daily.   Yes [provider]  levothyroxine (SYNTHROID, LEVOTHROID) 25 MCG tablet Take 50 mcg by mouth daily before breakfast.   Yes [provider]  metoprolol succinate (TOPROL-XL) 100 MG 24 hr tablet Take 200 mg by mouth daily. Take with or immediately following a meal.   Yes [provider]  pantoprazole (PROTONIX) 40 MG tablet Take 40 mg by mouth daily.   Yes [provider]  potassium chloride (KLOR-CON) 10 MEQ tablet Take 20 mEq by mouth daily. 10/06/22  Yes [provider]  pravastatin (PRAVACHOL) 10 MG tablet Take 20 mg by mouth daily.   Yes [provider]  Vitamin D, Cholecalciferol, 25 MCG (1000 UT) TABS Take 1 tablet by mouth daily.   Yes [provider]  fluticasone (FLONASE) 50 MCG/ACT nasal spray Place 1 spray into both nostrils daily. Patient not taking: Reported on 10/19/2022 03/30/18   Petrucelli, Samantha R, PA-C  ondansetron (ZOFRAN ODT) 4 MG disintegrating tablet Take 1 tablet (4 mg total) by mouth every 8 (eight) hours as needed for nausea or vomiting. Patient not taking: Reported on 10/25/2022 03/30/18   Petrucelli, Aldona Bar R, PA-C  oxyCODONE (OXY IR/ROXICODONE) 5 MG immediate release tablet Take 1-2 tablets (5-10 mg total) by mouth every 4 (four) hours as needed. Patient not taking: Reported on 10/17/2022 05/21/13   Saverio Danker, PA-C     Critical care time: 40 minutes     Baltazar Apo, MD, PhD 10/12/2022, 10:01 AM [facility] [phone] or if no answer before 7:00PM call [phone] For any issues after 7:00PM please call eLink [phone]

## 2022-10-29 NOTE — Progress Notes (Signed)
Appreciate care from Dr. Kae Heller and Dr. Lamonte Sakai. OP note reviewed, plan is for return to OR tomorrow. Pt intubated, will sign off.  Murray Hodgkins, MD [facility]

## 2022-10-29 NOTE — Progress Notes (Signed)
NP Zebedee Iba was notified that patient is a yellow MEWs with BP of 70/49 and MAP of 56. Yellow MEWs protocol was initiated.

## 2022-10-29 NOTE — Transfer of Care (Signed)
Immediate Anesthesia Transfer of Care Note  Patient: ELVINA RAN  Procedure(s) Performed: EXPLORATORY LAPAROTOMY; POSSIBLE BOWEL RESECTION (Abdomen)  Patient Location: ICU  Anesthesia Type:General  Level of Consciousness: sedated  Airway & Oxygen Therapy: Patient remains intubated per anesthesia plan and Patient placed on Ventilator (see vital sign flow sheet for setting)  Post-op Assessment: Report given to RN and Post -op Vital signs reviewed and stable  Post vital signs: Reviewed and stable  Last Vitals:  Vitals Value Taken Time  BP    Temp    Pulse    Resp    SpO2      Last Pain:  Vitals:   10/15/2022 0822  TempSrc: Axillary  PainSc:       Patients Stated Pain Goal: 2 ([id number])  Complications: No notable events documented.

## 2022-10-29 NOTE — Progress Notes (Signed)
Pharmacy Antibiotic Note  Elizabeth Shields is a 89 y.o. female admitted on 10/07/2022 with SBO.  Today, patient with some N/V and hypotensive and transferred to ICU.  Pharmacy has been consulted for cefepime dosing for possible IAI x 7 days.  Plan: Cefepime 2 g IV every 24 hours Flagyl 500 mg IV every 12 hours per provider Monitor clinical progress & renal function   Height: 5\' 3"  (160 cm) Weight: 73.3 kg (161 lb 9.6 oz) IBW/kg (Calculated) : 52.4  Temp (24hrs), Avg:97.5 F (36.4 C), Min:95.9 F (35.5 C), Max:99.3 F (37.4 C)  Recent Labs  Lab 10/13/2022 1120 10/28/22 0422 09/30/2022 0504  WBC 7.1  --  5.6  CREATININE 0.91 0.84 2.37*    Estimated Creatinine Clearance: 15.7 mL/min (A) (by C-G formula based on SCr of 2.37 mg/dL (H)).    Allergies  Allergen Reactions   Sulfa Antibiotics Nausea And Vomiting    Antimicrobials this admission: 3/30 cefepime and Flagyl >>     Thank you for allowing pharmacy to be a part of this patient's care.  Royetta Asal, PharmD, BCPS Clinical Pharmacist [place] Please utilize Amion for appropriate phone number to reach the unit pharmacist ([place]) 10/28/2022 8:53 AM

## 2022-10-29 NOTE — Anesthesia Postprocedure Evaluation (Signed)
Anesthesia Post Note  Patient: Elizabeth Shields  Procedure(s) Performed: EXPLORATORY LAPAROTOMY; POSSIBLE BOWEL RESECTION (Abdomen)     Patient location during evaluation: ICU Anesthesia Type: General Level of consciousness: patient remains intubated per anesthesia plan Pain management: pain level controlled Vital Signs Assessment: post-procedure vital signs reviewed and stable Respiratory status: patient on ventilator - see flowsheet for VS and patient remains intubated per anesthesia plan Cardiovascular status: stable Postop Assessment: no apparent nausea or vomiting Anesthetic complications: no  No notable events documented.  Last Vitals:  Vitals:   10/25/2022 0951 10/08/2022 0954  BP: (!) 71/37 (!) 93/49  Pulse:  93  Resp: (!) 35 (!) 43  Temp:    SpO2:  (!) 89%    Last Pain:  Vitals:   10/11/2022 0822  TempSrc: Axillary  PainSc:                  Huston Foley

## 2022-10-29 NOTE — Progress Notes (Addendum)
Subjective/Chief Complaint: Pt with some n/v and hypotensive this AM and was transferred to ICU Cr elev   Objective: Vital signs in last 24 hours: Temp:  [96.2 F (35.7 C)-99.3 F (37.4 C)] 96.2 F (35.7 C) (03/30 0746) Pulse Rate:  [70-91] 82 (03/30 0718) Resp:  [16] 16 (03/29 2050) BP: (63-152)/(38-86) 106/72 (03/30 0746) SpO2:  [87 %-97 %] 87 % (03/30 0718) Last BM Date : 10/26/22  Intake/Output from previous day: 03/29 0701 - 03/30 0700 In: 3340 [P.O.:640; I.V.:2700] Out: 500 [Urine:500] Intake/Output this shift: Total I/O In: 0  Out: 150 [Urine:150]  PE:  Constitutional: No acute distress, conversant, appears states age. Eyes: Anicteric sclerae, moist conjunctiva, no lid lag Lungs: Clear to auscultation bilaterally, normal respiratory effort CV: regular rate and rhythm, no murmurs, no peripheral edema, pedal pulses 2+ GI: Soft, no masses or hepatosplenomegaly, non-tender to palpation, min distended Skin: No rashes, palpation reveals normal turgor Psychiatric: appropriate judgment and insight, oriented to person, place, and time   Lab Results:  Recent Labs    10/19/2022 1120 10/28/2022 0504  WBC 7.1 5.6  HGB 14.0 16.7*  HCT 42.4 52.1*  PLT 242 204   BMET Recent Labs    10/28/22 0422 10/03/2022 0504  NA 129* 137  K 3.7 3.6  CL 95* 104  CO2 23 16*  GLUCOSE 156* 127*  BUN 15 33*  CREATININE 0.84 2.37*  CALCIUM 10.1 10.1   PT/INR No results for input(s): "LABPROT", "INR" in the last 72 hours. ABG No results for input(s): "PHART", "HCO3" in the last 72 hours.  Invalid input(s): "PCO2", "PO2"  Studies/Results: DG Abd Portable 1V  Result Date: 10/28/2022 CLINICAL DATA:  Small bowel obstruction.  Nausea and abdominal pain EXAM: PORTABLE ABDOMEN - 1 VIEW COMPARISON:  Radiograph from yesterday FINDINGS: Increased gaseous distension of small bowel loops in the right abdomen. No visible pneumatosis. No concerning mass effect or gas collection. Gas  dilated loops measure up to 3.7 cm, progressed from both prior radiograph and CT. IMPRESSION: Increased gaseous distension of small bowel loops in the right abdomen. Electronically Signed   By: Jorje Guild M.D.   On: 10/01/2022 07:16   DG Abd Portable 1V  Result Date: 10/28/2022 CLINICAL DATA:  Small bowel obstruction. EXAM: PORTABLE ABDOMEN - 1 VIEW COMPARISON:  November 05, 2012. FINDINGS: The bowel gas pattern is normal. No radio-opaque calculi or other significant radiographic abnormality are seen. IMPRESSION: Negative. Electronically Signed   By: Marijo Conception M.D.   On: 10/28/2022 08:22   CT CHEST ABDOMEN PELVIS W CONTRAST  Result Date: 10/09/2022 CLINICAL DATA:  Epigastric pain since 4 a.m. this morning. Previous cholecystectomy. Some vomiting EXAM: CT CHEST, ABDOMEN, AND PELVIS WITH CONTRAST TECHNIQUE: Multidetector CT imaging of the chest, abdomen and pelvis was performed following the standard protocol during bolus administration of intravenous contrast. RADIATION DOSE REDUCTION: This exam was performed according to the departmental dose-optimization program which includes automated exposure control, adjustment of the mA and/or kV according to patient size and/or use of iterative reconstruction technique. CONTRAST:  23mL OMNIPAQUE IOHEXOL 300 MG/ML  SOLN COMPARISON:  MRI and CT 2014.  Chest x-ray 2019 August FINDINGS: CT CHEST FINDINGS Cardiovascular: Trace pericardial fluid. The heart is nonenlarged. Coronary artery calcifications are seen. Calcifications along the aortic valve. The thoracic aorta overall has a normal course and caliber. Mediastinum/Nodes: Preserved thyroid gland. Patulous esophagus. No specific abnormal lymph node enlargement identified in the axillary region, hilum or mediastinum. Lungs/Pleura: There is some linear  opacity lung bases likely scar or atelectasis. No consolidation, pneumothorax or effusion. 3 mm right upper lobe nodule on series 4 image 35. Musculoskeletal: Mild  degenerative changes along the spine. CT ABDOMEN PELVIS FINDINGS Hepatobiliary: Prior cholecystectomy. Patent portal vein. Benign-appearing hepatic cysts are again identified. Largest is seen left hepatic lobe on series 2 image 44 measuring 3.9 by 4.0 cm. Slightly larger than 2014 but again benign and no specific imaging follow-up. Pancreas: Unremarkable. No pancreatic ductal dilatation or surrounding inflammatory changes. Spleen: Normal in size without focal abnormality. Adrenals/Urinary Tract: The adrenal glands are preserved. Upper pole right-sided benign-appearing renal cysts are identified. Largest measures 14 mm with Hounsfield unit of 10. Bosniak 1 lesions. No specific imaging follow-up. No collecting system dilatation. The ureters are seen extending down to the bladder. Preserved contours of the urinary bladder. Stomach/Bowel: Large bowel is of normal course and caliber with diffuse colonic stool and extensive colonic diverticulosis. Normal appendix. Stomach is nondilated. Dilated loops of fluid-filled small bowel in the right lower quadrant with diameter approaching 3.4 cm. There is an abrupt transition as seen on coronal image 66 and 67. Caliber change of 2 adjacent loops. Developing obstruction. With isolated area of please correlate also well for any evidence of an internal hernia. Mild mesenteric stranding. No free air or pneumatosis. Vascular/Lymphatic: Mild scattered vascular calcifications. Normal caliber aorta and IVC. No discrete abnormal lymph node enlargement identified in the abdomen and pelvis. Reproductive: Uterus and bilateral adnexa are unremarkable. Other: No abdominal wall hernia or abnormality. No abdominopelvic ascites. Musculoskeletal: Moderate degenerative changes of the lumbar spine. Listhesis at L3-4. Multilevel disc bulging and stenosis. Degenerative changes also seen of the pelvis. IMPRESSION: Short-segment dilated loop of ileum in the right lower quadrant. There is a caliber  change of 2 adjacent small bowel loops with some stranding. Please correlate for developing or partial obstruction. With the overall appearance and internal hernia would be in the differential versus adhesion. Mild adjacent inflammatory stranding but no pneumatosis or free air. Colonic diverticulosis. 3 mm right upper lobe lung nodule No follow-up needed if patient is low-risk.This recommendation follows the consensus statement: Guidelines for Management of Incidental Pulmonary Nodules Detected on CT Images: From the Fleischner Society 2017; Radiology 2017; 284:228-243. Electronically Signed   By: Jill Side M.D.   On: 10/03/2022 13:05    Anti-infectives: Anti-infectives (From admission, onward)    Start     Dose/Rate Route Frequency Ordered Stop   10/09/2022 2200  doxycycline (VIBRAMYCIN) capsule 100 mg  Status:  Discontinued        100 mg Oral 2 times daily 10/25/2022 1541 10/26/2022 1549       Assessment/Plan: SBO -no bowerl fxn, -plan for NGT and SBOP -Will follow with you.   FEN - ice chips, IVF per TRH VTE - SCDs, okay for chemical ppx from a general surgery standpoint ID - None     Elevated lipase - not consistent with pancreatitis and no signs of acute or chronic pancreatic inflammation on imaging  HTN Hypothyroidism OSA Hx CHF/HOCM Hepatic and Renal Cysts on CT Pulm Nodule on CT - Rec PCP f/u and surviellence as recommended by Radiology.    Moderate MDM   LOS: 2 days    Ralene Ok 10/08/2022

## 2022-10-29 NOTE — Progress Notes (Addendum)
    Patient Name: Elizabeth Shields           DOB: 05-08-1932  MRN: [id number]      Admission Date: 10/22/2022  Attending Provider: Samuella Cota, MD  Primary Diagnosis: SBO (small bowel obstruction)   Level of care: Stepdown    CROSS COVER NOTE   Date of Service   10/07/2022   Elizabeth Shields, 89 y.o. female, was admitted on 10/21/2022 for SBO (small bowel obstruction).    HPI/Events of Note   Hypotensive 70/40 (56), a/o. No acute abdominal changes. No signs of bleeding.   Fluid bolus ordered. Will reassess need for additional fluid and higher level of care after bolus.   0720- remains hypotensive, will transfer to Stepdown, additional bolus, and oral midodrine. Hold all antihypertensive medications.   Interventions/ Plan   X        Elizabeth Rover, DNP, [place]

## 2022-10-29 NOTE — Progress Notes (Signed)
NAME:  Elizabeth Shields, MRN:  [id number], DOB:  08-Mar-1932, LOS: 2 ADMISSION DATE:  10/17/2022, CONSULTATION DATE: 10/04/2022 REFERRING MD: Dr. Sarajane Jews, CHIEF COMPLAINT: Shock  History of Present Illness:  89 year old woman with history of hypertension and diastolic dysfunction, HOCM, hypothyroidism, OSA, borderline diabetes.  She is admitted with a partial small bowel obstruction 3/28.  Treated conservatively and initially stable but has developed increased abdominal pain, hypothermia and hypotension on 3/30.  Imaging with increased small bowel distention.  IV fluid resuscitation initiated with only transient response in her blood pressure.  She moved to the ICU 3/30 for further care.    She went to the OR 3/30 for exploratory laparotomy and underwent small bowel resection and placement of a wound VAC for ischemic bowel.  She returned to the ICU on mechanical ventilation, high-dose pressors  Pertinent  Medical History   Past Medical History:  Diagnosis Date   Anxiety    Arthritis    Borderline diabetes    Diastolic CHF ([place]) [id number]   GERD (gastroesophageal reflux disease)    HOCM (hypertrophic obstructive cardiomyopathy) ([place])    a. Dx 2011 by Dr. Elsie Stain [facility];  b. 10/2012 Echo: EF 65-70% w/ dynamic obstruction @ rest in the outflow tract w/ peak velocity of 500 cm/sec & peak gradient of 145mmHg, nl wall motion, Gr 1 DD, sev SAM of the ant leaflet, mild to mod MR,pasp 87mmHg.    Hypertension    Hypothyroidism    Sleep apnea    Thyroid disease     Significant Hospital Events: Including procedures, antibiotic start and stop dates in addition to other pertinent events   CT chest/abdomen/pelvis 10/08/2022 > short-segment dilated loop ileum in the right lower quadrant, some caliber change in adjacent small bowel loops with stranding.  No pneumatosis or free air. Abdominal x-ray 3/30 > increased gaseous distention of small bowel loops in the right  abdomen 3/30 to the ICU with shock, acute renal failure, metabolic acidosis [id number] to the OR for exploratory laparotomy  Interim History / Subjective:   Back to the ICU on mechanical ventilation.  Remains in shock and has been hypoxemic. Phenylephrine 100, norepinephrine 20 ABG with a severe mixed acidosis, pH 7.08  Objective   Blood pressure (!) 83/51, pulse 91, temperature (!) 95.9 F (35.5 C), temperature source Axillary, resp. rate 18, height 5\' 3"  (1.6 m), weight 73.3 kg, SpO2 (!) 80 %.    Vent Mode: PRVC FiO2 (%):  [50 %] 50 % Set Rate:  [14 bmp-32 bmp] 32 bmp Vt Set:  [410 mL] 410 mL PEEP:  [5 cmH20-8 cmH20] 8 cmH20 Plateau Pressure:  [17 cmH20] 17 cmH20   Intake/Output Summary (Last 24 hours) at 10/24/2022 1527 Last data filed at 10/01/2022 1059 Gross per 24 hour  Intake 4844.99 ml  Output 3475 ml  Net 1369.99 ml   Filed Weights   10/15/2022 1041  Weight: 73.3 kg    Examination: General: Critically ill woman, ventilated and sedated HENT: ET tube in good position, oropharynx otherwise clear.  She has a G-tube in place with bloody bilious secretions Lungs: Decreased at the bases, otherwise clear Cardiovascular: Distant, regular Abdomen: Distended but less so, wound VAC in place Extremities: No edema Neuro: Sedated.  Does not wake to voice or stimulation GU: Foley catheter  Resolved Hospital Problem list     Assessment & Plan:  Septic shock, possible superimposed hypovolemia, due to small bowel obstruction and bowel ischemia -Titrate pressors for  MAP 65 -Plan for arterial catheter placement -Add vasopressin to norepinephrine and phenylephrine.  Try to wean off the phenylephrine first -Continue cefepime and Flagyl -Correct acidosis -NG tube in place -She will likely go back to the OR Nov 24, 2022 if stable to do so  Acute renal failure due to ATN, septic shock and hypoperfusion Mixed respiratory and metabolic acidosis -Support blood pressure with IV fluids,  pressors -Follow urine output, follow BMP -Follow lactic acid trend, improving -Increase minute ventilation -Add bicarbonate infusion 125 cc/h -She would be a poor candidate for hemodialysis if there is adequate renal recovery.  Acute respiratory failure with hypoxemia due to the above.  Chest x-ray shows that ET tube can be withdrawn 1 cm but there are no significant infiltrates.  Could consider alternative explanation for her hypoxemia, consider PE -She is not in a position to travel to CT scanner be evaluated for PE at this time, would not tolerate IV contrast given her renal failure -Pullback ET tube 1 cm -Increase ventilation, increase PEEP to 12 and wean FiO2 as able -In absence of significant pulmonary infiltrates, plan to continue PRVC 8 cc/kg -Follow ABG and chest x-ray  Paroxysmal atrial fibrillation, not on home anticoagulation -Currently sinus rhythm -Diltiazem and metoprolol discontinued given shock state  HOCM, hypertension with chronic diastolic dysfunction -Lasix held -Antihypertensives held for now  Hypothyroidism -Levothyroxine changed to IV on 3/30  Best Practice (right click and "Reselect all SmartList Selections" daily)   Diet/type: NPO DVT prophylaxis: SCD GI prophylaxis: PPI Lines: N/A Foley:  N/A Code Status:  DNR Last date of multidisciplinary goals of care discussion [discussed with the patient and family at bedside 3/30. I have explained to the patient and family that she may come out of the OR critically ill, on pressors, potentially even mechanically ventilated.  They are willing to accept short-term critical care support to get over what is hopefully an acute reversible event.  They are clear that she would not want prolonged extraordinary support and that her CODE STATUS will transition to DNR postoperatively.]  Labs   CBC: Recent Labs  Lab 10/22/2022 1120 10/09/2022 0504  WBC 7.1 5.6  HGB 14.0 16.7*  HCT 42.4 52.1*  MCV 81.9 84.0  PLT 242 204     Basic Metabolic Panel: Recent Labs  Lab 10/17/2022 1120 10/28/22 0422 10/24/2022 0504  NA 132* 129* 137  K 4.5 3.7 3.6  CL 99 95* 104  CO2 23 23 16*  GLUCOSE 136* 156* 127*  BUN 19 15 33*  CREATININE 0.91 0.84 2.37*  CALCIUM 11.0* 10.1 10.1   GFR: Estimated Creatinine Clearance: 15.7 mL/min (A) (by C-G formula based on SCr of 2.37 mg/dL (H)). Recent Labs  Lab 10/26/2022 1120 10/22/2022 0504 10/16/2022 0914 10/24/2022 1315  WBC 7.1 5.6  --   --   LATICACIDVEN  --   --  6.2* 3.3*    Liver Function Tests: Recent Labs  Lab 09/30/2022 1120 10/28/22 0422  AST 20 22  ALT 12 15  ALKPHOS 123 119  BILITOT 0.6 1.0  PROT 8.7* 8.1  ALBUMIN 4.6 4.3   Recent Labs  Lab 10/22/2022 1120  LIPASE 70*   No results for input(s): "AMMONIA" in the last 168 hours.  ABG    Component Value Date/Time   PHART 7.06 (LL) 10/29/2022 1343   PCO2ART 51 (H) 09/30/2022 1343   PO2ART 68 (L) 10/08/2022 1343   HCO3 14.4 (L) 10/28/2022 1343   ACIDBASEDEF 16.0 (H) 10/15/2022 1343   O2SAT 89.3  10/14/2022 1343     Coagulation Profile: Recent Labs  Lab 10/28/2022 0914  INR 1.4*    Cardiac Enzymes: No results for input(s): "CKTOTAL", "CKMB", "CKMBINDEX", "TROPONINI" in the last 168 hours.  HbA1C: No results found for: "HGBA1C"  CBG: No results for input(s): "GLUCAP" in the last 168 hours.  Review of Systems:   As per HPI  Past Medical History:  She,  has a past medical history of Anxiety, Arthritis, Borderline diabetes, Diastolic CHF ([place]) (123XX123), GERD (gastroesophageal reflux disease), HOCM (hypertrophic obstructive cardiomyopathy) ([place]), Hypertension, Hypothyroidism, Sleep apnea, and Thyroid disease.   Surgical History:   Past Surgical History:  Procedure Laterality Date   CHOLECYSTECTOMY N/A 05/20/2013   Procedure: LAPAROSCOPIC CHOLECYSTECTOMY WITH INTRAOPERATIVE CHOLANGIOGRAM;  Surgeon: Edward Jolly, MD;  Location: [facility];  Service: General;  Laterality: N/A;   TUBAL  LIGATION       Social History:   reports that she has never smoked. She has never used smokeless tobacco. She reports that she does not drink alcohol and does not use drugs.   Family History:  Her family history includes Heart disease in her unknown relative.   Allergies Allergies  Allergen Reactions   Sulfa Antibiotics Nausea And Vomiting     Home Medications  Prior to Admission medications   Medication Sig Start Date End Date Taking? Authorizing Provider  ACETAMINOPHEN PO Take 1,300 mg by mouth every 8 (eight) hours as needed for pain.   Yes [provider]  ALPRAZolam (XANAX) 0.5 MG tablet 0.5 mg 2 (two) times daily.   Yes [provider]  aspirin 81 MG tablet Take 81 mg by mouth daily.   Yes [provider]  dicyclomine (BENTYL) 10 MG capsule Take 10 mg by mouth 2 (two) times daily.   Yes [provider]  diltiazem (DILACOR XR) 240 MG 24 hr capsule Take 240 mg by mouth daily.   Yes [provider]  doxycycline (VIBRAMYCIN) 100 MG capsule Take 1 capsule (100 mg total) by mouth 2 (two) times daily. 03/30/18  Yes Petrucelli, Samantha R, PA-C  furosemide (LASIX) 20 MG tablet Take 20 mg by mouth daily.   Yes [provider]  levothyroxine (SYNTHROID, LEVOTHROID) 25 MCG tablet Take 50 mcg by mouth daily before breakfast.   Yes [provider]  metoprolol succinate (TOPROL-XL) 100 MG 24 hr tablet Take 200 mg by mouth daily. Take with or immediately following a meal.   Yes [provider]  pantoprazole (PROTONIX) 40 MG tablet Take 40 mg by mouth daily.   Yes [provider]  potassium chloride (KLOR-CON) 10 MEQ tablet Take 20 mEq by mouth daily. 10/06/22  Yes [provider]  pravastatin (PRAVACHOL) 10 MG tablet Take 20 mg by mouth daily.   Yes [provider]  Vitamin D, Cholecalciferol, 25 MCG (1000 UT) TABS Take 1 tablet by mouth daily.   Yes [provider]  fluticasone (FLONASE) 50  MCG/ACT nasal spray Place 1 spray into both nostrils daily. Patient not taking: Reported on 10/11/2022 03/30/18   Petrucelli, Samantha R, PA-C  ondansetron (ZOFRAN ODT) 4 MG disintegrating tablet Take 1 tablet (4 mg total) by mouth every 8 (eight) hours as needed for nausea or vomiting. Patient not taking: Reported on 10/17/2022 03/30/18   Petrucelli, Aldona Bar R, PA-C  oxyCODONE (OXY IR/ROXICODONE) 5 MG immediate release tablet Take 1-2 tablets (5-10 mg total) by mouth every 4 (four) hours as needed. Patient not taking: Reported on 10/07/2022 05/21/13   Maxwell Caul,  Claiborne Billings, PA-C     Critical care time: 60 minutes     Baltazar Apo, MD, PhD 10/11/2022, 3:27 PM [facility] [phone] or if no answer before 7:00PM call [phone] For any issues after 7:00PM please call eLink [phone]

## 2022-10-30 ENCOUNTER — Inpatient Hospital Stay (HOSPITAL_COMMUNITY): Payer: Medicare Other

## 2022-10-30 DIAGNOSIS — A419 Sepsis, unspecified organism: Secondary | ICD-10-CM | POA: Diagnosis not present

## 2022-10-30 DIAGNOSIS — R6521 Severe sepsis with septic shock: Secondary | ICD-10-CM | POA: Diagnosis not present

## 2022-10-30 DIAGNOSIS — K56609 Unspecified intestinal obstruction, unspecified as to partial versus complete obstruction: Secondary | ICD-10-CM | POA: Diagnosis not present

## 2022-10-30 LAB — BLOOD GAS, ARTERIAL
Acid-base deficit: 13.8 mmol/L — ABNORMAL HIGH (ref 0.0–2.0)
Bicarbonate: 12.7 mmol/L — ABNORMAL LOW (ref 20.0–28.0)
Drawn by: 59133
FIO2: 100 %
O2 Saturation: 95.2 %
PEEP: 10 cmH2O
Patient temperature: 36.1
RATE: 32 resp/min
pCO2 arterial: 30 mmHg — ABNORMAL LOW (ref 32–48)
pH, Arterial: 7.23 — ABNORMAL LOW (ref 7.35–7.45)
pO2, Arterial: 66 mmHg — ABNORMAL LOW (ref 83–108)

## 2022-10-30 SURGERY — LAPAROTOMY, EXPLORATORY
Anesthesia: General

## 2022-10-30 MED ORDER — NOREPINEPHRINE 16 MG/250ML-% IV SOLN
0.0000 ug/min | INTRAVENOUS | Status: DC
  Administered 2022-10-30: 60 ug/min via INTRAVENOUS
  Administered 2022-10-30: 40 ug/min via INTRAVENOUS
  Filled 2022-10-30 (×2): qty 250

## 2022-10-30 MED ORDER — SODIUM BICARBONATE 8.4 % IV SOLN
50.0000 meq | Freq: Once | INTRAVENOUS | Status: AC
  Administered 2022-10-30: 50 meq via INTRAVENOUS
  Filled 2022-10-30: qty 100

## 2022-10-31 ENCOUNTER — Encounter (HOSPITAL_COMMUNITY): Payer: Self-pay | Admitting: Surgery

## 2022-10-31 NOTE — Progress Notes (Signed)
1 Day Post-Op   Subjective/Chief Complaint: Unfortunately has had refractory shock throughout the night with blood pressures in the 60s despite max dose levo, neo-, and vaso; stress dose steroids, and bicarb drip.  Leukopenic with a lactate greater than 9 on last night's labs.   Objective: Vital signs in last 24 hours: Temp:  [95.9 F (35.5 C)-98.4 F (36.9 C)] 98.1 F (36.7 C) 11/16/22 0400) Pulse Rate:  [58-99] 58 11-16-2022 0750) Resp:  [14-54] 20 16-Nov-2022 0750) BP: (51-144)/(11-84) 113/62 11-16-22 0339) SpO2:  [76 %-99 %] 86 % 2022/11/16 0750) Arterial Line BP: (19-159)/(12-96) 19/12 11/16/2022 0750) FiO2 (%):  [50 %-100 %] 100 % 11-16-2022 0728) Weight:  [73.3 kg] 73.3 kg 11/16/22 0700) Last BM Date : 10/26/22  Intake/Output from previous day: 03/30 0701 - November 16, 2022 0700 In: 12005 [I.V.:8389.7; IV Piggyback:3615.4] Out: 4600 [Urine:775; Emesis/NG output:500; Drains:800; Blood:25] Intake/Output this shift: No intake/output data recorded.  Intubated, sedated On vent with 100% FiO2 Max dose 3 pressors plus bicarb drip Abdomen soft, wound VAC holding suction   Lab Results:  Recent Labs    10/28/2022 0504 10/09/2022 1721  WBC 5.6 1.9*  HGB 16.7* 12.5  HCT 52.1* 38.8  PLT 204 166  167   BMET Recent Labs    10/02/2022 0504 10/07/2022 1721  NA 137 132*  K 3.6 2.8*  CL 104 107  CO2 16* 17*  GLUCOSE 127* 145*  BUN 33* 31*  CREATININE 2.37* 1.87*  CALCIUM 10.1 6.4*   PT/INR Recent Labs    10/12/2022 0914 10/07/2022 1721  LABPROT 16.8* 21.5*  INR 1.4* 1.9*   ABG Recent Labs    10/09/2022 1609 2022/11/16 0357  PHART 7.26* 7.23*  HCO3 15.3* 12.7*    Studies/Results: DG Abd 1 View  Result Date: 10/02/2022 CLINICAL DATA:  [id number] Encounter for imaging study to confirm nasogastric (NG) tube placement [id number] EXAM: ABDOMEN - 1 VIEW COMPARISON:  Earlier film of the same day FINDINGS: Gastric tube has been advanced into the stomach which is partially decompressed. Cholecystectomy clips. Visualized  bowel gas pattern unremarkable. The lower abdomen is is excluded. Stable lower thoracic and lumbar degenerative change. IMPRESSION: Gastric tube advanced into the stomach. Electronically Signed   By: Lucrezia Europe M.D.   On: 10/08/2022 13:10   DG CHEST PORT 1 VIEW  Result Date: 10/03/2022 CLINICAL DATA:  [id number] Acute hypoxic respiratory failure [id number] EXAM: PORTABLE CHEST - 1 VIEW COMPARISON:  03/30/2018 FINDINGS: Endotracheal tube tip is in the proximal right mainstem bronchus, should probably be retracted 3 cm. Gastric tube extends into the partially distended stomach. Right arm central line to the distal SVC. Low lung volumes with patchy left retrocardiac consolidation/atelectasis. Heart size and mediastinal contours are within normal limits. Aortic Atherosclerosis (ICD10-170.0). Blunting of right lateral costophrenic angle. Bilateral shoulder DJD.  Cholecystectomy clips. IMPRESSION: Endotracheal tube tip in the proximal right mainstem bronchus, should probably be retracted 3 cm. Electronically Signed   By: Lucrezia Europe M.D.   On: 10/22/2022 13:09   [place] EKG SITE RITE  Result Date: 10/25/2022 If Site Rite image not attached, placement could not be confirmed due to current cardiac rhythm.  DG Abd Portable 1V  Result Date: 10/08/2022 CLINICAL DATA:  Small bowel obstruction.  Nausea and abdominal pain EXAM: PORTABLE ABDOMEN - 1 VIEW COMPARISON:  Radiograph from yesterday FINDINGS: Increased gaseous distension of small bowel loops in the right abdomen. No visible pneumatosis. No concerning mass effect or gas collection. Gas dilated loops measure up to 3.7 cm, progressed  from both prior radiograph and CT. IMPRESSION: Increased gaseous distension of small bowel loops in the right abdomen. Electronically Signed   By: Jorje Guild M.D.   On: 10/04/2022 07:16    Anti-infectives: Anti-infectives (From admission, onward)    Start     Dose/Rate Route Frequency Ordered Stop   11-08-22 1000  ceFEPIme (MAXIPIME)  2 g in sodium chloride 0.9 % 100 mL IVPB  Status:  Discontinued        2 g 200 mL/hr over 30 Minutes Intravenous Daily 10/13/2022 0848 10/09/2022 0849   08-Nov-2022 1000  ceFEPIme (MAXIPIME) 2 g in sodium chloride 0.9 % 100 mL IVPB        2 g 200 mL/hr over 30 Minutes Intravenous Daily 10/14/2022 0849 11/05/22 0959   10/23/2022 0930  ceFEPIme (MAXIPIME) 2 g in sodium chloride 0.9 % 100 mL IVPB        2 g 200 mL/hr over 30 Minutes Intravenous  Once 10/15/2022 0840     10/15/2022 0930  metroNIDAZOLE (FLAGYL) IVPB 500 mg        500 mg 100 mL/hr over 60 Minutes Intravenous Every 12 hours 10/02/2022 0840 11/05/22 0929   10/10/2022 2200  doxycycline (VIBRAMYCIN) capsule 100 mg  Status:  Discontinued        100 mg Oral 2 times daily 10/01/2022 1541 10/01/2022 1549       Assessment/Plan: s/p Procedure(s): EXPLORATORY LAPAROTOMY; POSSIBLE BOWEL RESECTION (N/A) Ischemic bowel status post resection despite which has continued with refractory shock that was present prior to surgery and multiorgan dysfunction that was present prior to surgery.  She is not stable to return to the OR this morning and I discussed with her family at the bedside that she is unlikely to survive this.  They seem to have appropriate understanding of the situation.  Appreciate great care from the ICU team.  Surgery team will be available as needed.    LOS: 3 days    Clovis Riley 11-08-22

## 2022-10-31 NOTE — Progress Notes (Signed)
NAME:  Elizabeth Shields, MRN:  [id number], DOB:  Dec 05, 1931, LOS: 3 ADMISSION DATE:  10/04/2022, CONSULTATION DATE: 10/02/2022 REFERRING MD: Dr. Sarajane Jews, CHIEF COMPLAINT: Shock  History of Present Illness:  89 year old woman with history of hypertension and diastolic dysfunction, HOCM, hypothyroidism, OSA, borderline diabetes.  She is admitted with a partial small bowel obstruction 3/28.  Treated conservatively and initially stable but has developed increased abdominal pain, hypothermia and hypotension on 3/30.  Imaging with increased small bowel distention.  IV fluid resuscitation initiated with only transient response in her blood pressure.  She moved to the ICU 3/30 for further care.    She went to the OR 3/30 for exploratory laparotomy and underwent small bowel resection and placement of a wound VAC for ischemic bowel.  She returned to the ICU on mechanical ventilation, high-dose pressors  Pertinent  Medical History   Past Medical History:  Diagnosis Date   Anxiety    Arthritis    Borderline diabetes    Diastolic CHF ([place]) [id number]   GERD (gastroesophageal reflux disease)    HOCM (hypertrophic obstructive cardiomyopathy) ([place])    a. Dx 2011 by Dr. Elsie Stain [facility];  b. 10/2012 Echo: EF 65-70% w/ dynamic obstruction @ rest in the outflow tract w/ peak velocity of 500 cm/sec & peak gradient of 142mmHg, nl wall motion, Gr 1 DD, sev SAM of the ant leaflet, mild to mod MR,pasp 81mmHg.    Hypertension    Hypothyroidism    Sleep apnea    Thyroid disease     Significant Hospital Events: Including procedures, antibiotic start and stop dates in addition to other pertinent events   CT chest/abdomen/pelvis 10/04/2022 > short-segment dilated loop ileum in the right lower quadrant, some caliber change in adjacent small bowel loops with stranding.  No pneumatosis or free air. Abdominal x-ray 3/30 > increased gaseous distention of small bowel loops in the right  abdomen 3/30 to the ICU with shock, acute renal failure, metabolic acidosis [id number] to the OR for exploratory laparotomy > small bowel resection for bowel ischemia  Interim History / Subjective:  1.00, PEEP 10 Progressive shock despite escalation all medical care, phenylephrine 500, norepinephrine 40, vasopressin Urine output 450 cc in 24 hours Persistent severe metabolic acidosis  Objective   Blood pressure 113/62, pulse 82, temperature 98.1 F (36.7 C), temperature source Axillary, resp. rate (!) 32, height 5\' 3"  (1.6 m), weight 73.3 kg, SpO2 91 %.    Vent Mode: PRVC FiO2 (%):  [50 %-100 %] 100 % Set Rate:  [14 bmp-32 bmp] 32 bmp Vt Set:  [410 mL] 410 mL PEEP:  [5 cmH20-10 cmH20] 10 cmH20 Plateau Pressure:  [17 cmH20-25 cmH20] 25 cmH20   Intake/Output Summary (Last 24 hours) at 11/21/2022 0726 Last data filed at November 21, 2022 0655 Gross per 24 hour  Intake 12005.01 ml  Output 4125 ml  Net 7880.01 ml   Filed Weights   10/20/2022 1041  Weight: 73.3 kg    Examination: General: Critically ill woman, ventilated.  Appears comfortable HENT: ET tube in position, G-tube with bloody bilious secretions Lungs: Creased bilaterally Cardiovascular: Distant, regular Abdomen: Distended, wound VAC in place Extremities: No edema Neuro: Does not wake to voice or stimulation GU: Foley catheter  Chest x-ray reviewed by me 2022/11/21, evolving right greater than left pleural effusions  Resolved Hospital Problem list     Assessment & Plan:  Septic shock, possible superimposed hypovolemia, due to small bowel obstruction and bowel ischemia -Pressors have  been maximized, remains in shock.  Prognosis at this point exceedingly poor.  Will discuss with family possible transition to comfort.  For now continue norepinephrine, phenylephrine, vasopressin -Cefepime, Flagyl -Not in a position to go back to the OR  Acute renal failure due to ATN, septic shock and hypoperfusion Mixed respiratory and metabolic  acidosis -Supporting blood pressure and renal perfusion maximally -Progressive worsening lactic acid -Continue bicarbonate infusion for now.  Considering transition to comfort -Not a candidate for hemodialysis  Acute respiratory failure with hypoxemia due to the above.  Chest x-ray shows that ET tube can be withdrawn 1 cm but there are no significant infiltrates.  Could consider alternative explanation for her hypoxemia, consider PE -She is not in a position to travel to CT scanner be evaluated for PE at this time, would not tolerate IV contrast given her renal failure -Continue current ventilator support.  Will broach subject of possible transition to comfort.  High FiO2 and PEEP needs  Paroxysmal atrial fibrillation, not on home anticoagulation -Diltiazem, metoprolol discontinued  HOCM, hypertension with chronic diastolic dysfunction Antihypertensives and diuretics are held -Antihypertensives held for now  Hypothyroidism -Levothyroxine changed to IV on 3/30  Best Practice (right click and "Reselect all SmartList Selections" daily)   Diet/type: NPO DVT prophylaxis: SCD GI prophylaxis: PPI Lines: N/A Foley:  N/A Code Status:  DNR Last date of multidisciplinary goals of care discussion [discussed with multiple family members at bedside 2022/11/26.  Have explained that despite all aggressive care she is declining and will not survive.  They understand, have been here all night.  If the opportunity presents then we might try to transition to comfort care in a formal fashion.  Will continue to discuss with them.]  Labs   CBC: Recent Labs  Lab 10/24/2022 1120 10/28/2022 0504 10/28/2022 1721  WBC 7.1 5.6 1.9*  HGB 14.0 16.7* 12.5  HCT 42.4 52.1* 38.8  MCV 81.9 84.0 84.3  PLT 242 204 166  [id number]    Basic Metabolic Panel: Recent Labs  Lab 10/04/2022 1120 10/28/22 0422 10/11/2022 0504 10/20/2022 1721  NA 132* 129* 137 132*  K 4.5 3.7 3.6 2.8*  CL 99 95* 104 107  CO2 23 23 16* 17*  GLUCOSE  136* 156* 127* 145*  BUN 19 15 33* 31*  CREATININE 0.91 0.84 2.37* 1.87*  CALCIUM 11.0* 10.1 10.1 6.4*   GFR: Estimated Creatinine Clearance: 20 mL/min (A) (by C-G formula based on SCr of 1.87 mg/dL (H)). Recent Labs  Lab 10/16/2022 1120 10/03/2022 0504 10/03/2022 0914 10/07/2022 1315 10/02/2022 1548 10/19/2022 1721 10/07/2022 2130  WBC 7.1 5.6  --   --   --  1.9*  --   LATICACIDVEN  --   --  6.2* 3.3* 5.4*  --  >9.0*    Liver Function Tests: Recent Labs  Lab 10/19/2022 1120 10/28/22 0422  AST 20 22  ALT 12 15  ALKPHOS 123 119  BILITOT 0.6 1.0  PROT 8.7* 8.1  ALBUMIN 4.6 4.3   Recent Labs  Lab 10/07/2022 1120  LIPASE 70*   No results for input(s): "AMMONIA" in the last 168 hours.  ABG    Component Value Date/Time   PHART 7.23 (L) 2022/11/26 0357   PCO2ART 30 (L) 11-26-22 0357   PO2ART 66 (L) 2022/11/26 0357   HCO3 12.7 (L) 11/26/22 0357   ACIDBASEDEF 13.8 (H) 2022-11-26 0357   O2SAT 95.2 26-Nov-2022 0357     Coagulation Profile: Recent Labs  Lab 10/29/22 0914 10/29/22 1721  INR  1.4* 1.9*    Cardiac Enzymes: No results for input(s): "CKTOTAL", "CKMB", "CKMBINDEX", "TROPONINI" in the last 168 hours.  HbA1C: No results found for: "HGBA1C"  CBG: No results for input(s): "GLUCAP" in the last 168 hours.  Review of Systems:   As per HPI  Past Medical History:  She,  has a past medical history of Anxiety, Arthritis, Borderline diabetes, Diastolic CHF ([place]) ([id number]), GERD (gastroesophageal reflux disease), HOCM (hypertrophic obstructive cardiomyopathy) ([place]), Hypertension, Hypothyroidism, Sleep apnea, and Thyroid disease.   Surgical History:   Past Surgical History:  Procedure Laterality Date   CHOLECYSTECTOMY N/A 05/20/2013   Procedure: LAPAROSCOPIC CHOLECYSTECTOMY WITH INTRAOPERATIVE CHOLANGIOGRAM;  Surgeon: Edward Jolly, MD;  Location: [facility];  Service: General;  Laterality: N/A;   TUBAL LIGATION       Social History:   reports that she has  never smoked. She has never used smokeless tobacco. She reports that she does not drink alcohol and does not use drugs.   Family History:  Her family history includes Heart disease in her unknown relative.   Allergies Allergies  Allergen Reactions   Sulfa Antibiotics Nausea And Vomiting     Home Medications  Prior to Admission medications   Medication Sig Start Date End Date Taking? Authorizing Provider  ACETAMINOPHEN PO Take 1,300 mg by mouth every 8 (eight) hours as needed for pain.   Yes [provider]  ALPRAZolam (XANAX) 0.5 MG tablet 0.5 mg 2 (two) times daily.   Yes [provider]  aspirin 81 MG tablet Take 81 mg by mouth daily.   Yes [provider]  dicyclomine (BENTYL) 10 MG capsule Take 10 mg by mouth 2 (two) times daily.   Yes [provider]  diltiazem (DILACOR XR) 240 MG 24 hr capsule Take 240 mg by mouth daily.   Yes [provider]  doxycycline (VIBRAMYCIN) 100 MG capsule Take 1 capsule (100 mg total) by mouth 2 (two) times daily. 03/30/18  Yes Petrucelli, Samantha R, PA-C  furosemide (LASIX) 20 MG tablet Take 20 mg by mouth daily.   Yes [provider]  levothyroxine (SYNTHROID, LEVOTHROID) 25 MCG tablet Take 50 mcg by mouth daily before breakfast.   Yes [provider]  metoprolol succinate (TOPROL-XL) 100 MG 24 hr tablet Take 200 mg by mouth daily. Take with or immediately following a meal.   Yes [provider]  pantoprazole (PROTONIX) 40 MG tablet Take 40 mg by mouth daily.   Yes [provider]  potassium chloride (KLOR-CON) 10 MEQ tablet Take 20 mEq by mouth daily. 10/06/22  Yes [provider]  pravastatin (PRAVACHOL) 10 MG tablet Take 20 mg by mouth daily.   Yes [provider]  Vitamin D, Cholecalciferol, 25 MCG (1000 UT) TABS Take 1 tablet by mouth daily.   Yes [provider]  fluticasone (FLONASE) 50 MCG/ACT nasal spray Place 1 spray into both nostrils  daily. Patient not taking: Reported on 10/05/2022 03/30/18   Petrucelli, Samantha R, PA-C  ondansetron (ZOFRAN ODT) 4 MG disintegrating tablet Take 1 tablet (4 mg total) by mouth every 8 (eight) hours as needed for nausea or vomiting. Patient not taking: Reported on 10/12/2022 03/30/18   Petrucelli, Aldona Bar R, PA-C  oxyCODONE (OXY IR/ROXICODONE) 5 MG immediate release tablet Take 1-2 tablets (5-10 mg total) by mouth every 4 (four) hours as needed. Patient not taking: Reported on 10/11/2022 05/21/13   Saverio Danker, PA-C     Critical care time: 35 minutes  Baltazar Apo, MD, PhD 11-25-22, 7:26 AM [facility] [phone] or if no answer before 7:00PM call [phone] For any issues after 7:00PM please call eLink [phone]

## 2022-10-31 NOTE — Progress Notes (Signed)
Chaplain was paged to support family after Elizabeth Shields passed.  Their pastor had been present previously this morning and so chaplain provided individual and group support as they continue to support each other.  [address], [place] Pager, [phone]

## 2022-10-31 NOTE — Progress Notes (Signed)
[facility] Progress Note Patient Name: Elizabeth Shields DOB: 28-Nov-1931 MRN: [id number]   Date of Service  2022/11/04  HPI/Events of Note  Pt with persistent hypotension, SBP down to 70s  eICU Interventions  Increased ceiling dose of levophed to 51mcg/min, phenylephrine to 55mcg/min.  Already on fixed dose vasopressin and stress dose steroids. Will continue maximal care.  Overall prognosis poor.          Lockie Bothun M DELA CRUZ 2022/11/04, 5:28 AM

## 2022-10-31 DEATH — deceased

## 2022-11-01 LAB — SURGICAL PATHOLOGY

## 2022-11-03 DIAGNOSIS — N179 Acute kidney failure, unspecified: Secondary | ICD-10-CM | POA: Diagnosis not present

## 2022-11-03 DIAGNOSIS — J9600 Acute respiratory failure, unspecified whether with hypoxia or hypercapnia: Secondary | ICD-10-CM | POA: Diagnosis not present

## 2022-11-03 DIAGNOSIS — K559 Vascular disorder of intestine, unspecified: Secondary | ICD-10-CM | POA: Diagnosis not present

## 2022-11-03 LAB — CULTURE, BLOOD (ROUTINE X 2)
Culture: NO GROWTH
Culture: NO GROWTH
Special Requests: ADEQUATE
Special Requests: ADEQUATE

## 2022-11-30 NOTE — Death Summary Note (Signed)
DEATH SUMMARY   Patient Details  Name: Elizabeth Shields MRN: [id number] DOB: 1932/07/06  Admission/Discharge Information   Admit Date:  Nov 15, 2022  Date of Death: Date of Death: 11-18-2022  Time of Death: Time of Death: 0750  Length of Stay: 3  Referring Physician: Wilhelmina Mcardle, MD   Reason(s) for Hospitalization  Abdominal Pain  Diagnoses  Preliminary cause of death:  Septic Shock due to ischemic bowel  Secondary Diagnoses (including complications and co-morbidities):  Principal Problem:   Septic shock Active Problems:   HTN (hypertension)   HOCM (hypertrophic obstructive cardiomyopathy)   OSA (obstructive sleep apnea)   Diastolic CHF   SBO (small bowel obstruction)   Small bowel obstruction   Atrial fibrillation   Hyponatremia   Acute renal failure   Small bowel ischemia   Acute respiratory failure   Brief Hospital Course (including significant findings, care, treatment, and services provided and events leading to death)  Elizabeth Shields is a 89 y.o. year old female with history of hypertension and diastolic dysfunction, HOCM, hypothyroidism, OSA, borderline diabetes.  She was admitted with abdominal pain and a partial small bowel obstruction 11-15-22.  She was treated conservatively with bowel rest and initially stabilized, but developed increased abdominal pain, hypothermia and hypotension on 3/30.  Imaging with increased small bowel distention.  IV fluid resuscitation initiated with only transient response in her blood pressure.  She moved to the ICU 3/30 for further care.  She was started on vasopressors. Labs revealed acute renal failure, lactic acidosis and progressive hyponatremia.   She went to the OR 3/30 for exploratory laparotomy and underwent small bowel resection and placement of a wound VAC for ischemic bowel.  She returned to the ICU with acute respiratory failure on mechanical ventilation, high-dose pressors. She remained on broad spectrum antibiotics. Stress  dose steroids were started. Despite all aggressive care her blood pressure never stabilized and she declined. Her pressor needs, ventilator needs increased, and her renal failure and acidosis worsened. She developed PEA and then asystole the morning of 11/18/22.    Pertinent Labs and Studies  Significant Diagnostic Studies DG Chest Port 1 View  Result Date: 2022-11-18 CLINICAL DATA:  [id number] Respiratory failure [id number] EXAM: PORTABLE CHEST - 1 VIEW COMPARISON:  the previous day's study FINDINGS: Endotracheal tube, gastric tube, right PICC stable in position. Persistent left infrahilar consolidation. Perihilar interstitial opacities left greater than right stable. Heart size and mediastinal contours are within normal limits. Aortic Atherosclerosis (ICD10-170.0). Blunting of right lateral costophrenic angle as before. Vertebral endplate spurring at multiple levels in the mid and lower thoracic spine. Cholecystectomy clips. IMPRESSION: 1. Persistent left infrahilar consolidation. 2. Stable support hardware. Electronically Signed   By: Lucrezia Europe M.D.   On: November 18, 2022 08:39   DG Abd 1 View  Result Date: 10/25/2022 CLINICAL DATA:  [id number] Encounter for imaging study to confirm nasogastric (NG) tube placement [id number] EXAM: ABDOMEN - 1 VIEW COMPARISON:  Earlier film of the same day FINDINGS: Gastric tube has been advanced into the stomach which is partially decompressed. Cholecystectomy clips. Visualized bowel gas pattern unremarkable. The lower abdomen is is excluded. Stable lower thoracic and lumbar degenerative change. IMPRESSION: Gastric tube advanced into the stomach. Electronically Signed   By: Lucrezia Europe M.D.   On: 10/22/2022 13:10   DG CHEST PORT 1 VIEW  Result Date: 10/12/2022 CLINICAL DATA:  [id number] Acute hypoxic respiratory failure [id number] EXAM: PORTABLE CHEST - 1 VIEW COMPARISON:  03/30/2018 FINDINGS: Endotracheal tube tip is in the  proximal right mainstem bronchus, should probably be retracted 3 cm.  Gastric tube extends into the partially distended stomach. Right arm central line to the distal SVC. Low lung volumes with patchy left retrocardiac consolidation/atelectasis. Heart size and mediastinal contours are within normal limits. Aortic Atherosclerosis (ICD10-170.0). Blunting of right lateral costophrenic angle. Bilateral shoulder DJD.  Cholecystectomy clips. IMPRESSION: Endotracheal tube tip in the proximal right mainstem bronchus, should probably be retracted 3 cm. Electronically Signed   By: Lucrezia Europe M.D.   On: 10/08/2022 13:09   [place] EKG SITE RITE  Result Date: 10/12/2022 If Site Rite image not attached, placement could not be confirmed due to current cardiac rhythm.  DG Abd Portable 1V  Result Date: 10/05/2022 CLINICAL DATA:  Small bowel obstruction.  Nausea and abdominal pain EXAM: PORTABLE ABDOMEN - 1 VIEW COMPARISON:  Radiograph from yesterday FINDINGS: Increased gaseous distension of small bowel loops in the right abdomen. No visible pneumatosis. No concerning mass effect or gas collection. Gas dilated loops measure up to 3.7 cm, progressed from both prior radiograph and CT. IMPRESSION: Increased gaseous distension of small bowel loops in the right abdomen. Electronically Signed   By: Jorje Guild M.D.   On: 10/01/2022 07:16   DG Abd Portable 1V  Result Date: 10/28/2022 CLINICAL DATA:  Small bowel obstruction. EXAM: PORTABLE ABDOMEN - 1 VIEW COMPARISON:  November 05, 2012. FINDINGS: The bowel gas pattern is normal. No radio-opaque calculi or other significant radiographic abnormality are seen. IMPRESSION: Negative. Electronically Signed   By: Marijo Conception M.D.   On: 10/28/2022 08:22   CT CHEST ABDOMEN PELVIS W CONTRAST  Result Date: 10/27/2022 CLINICAL DATA:  Epigastric pain since 4 a.m. this morning. Previous cholecystectomy. Some vomiting EXAM: CT CHEST, ABDOMEN, AND PELVIS WITH CONTRAST TECHNIQUE: Multidetector CT imaging of the chest, abdomen and pelvis was performed following  the standard protocol during bolus administration of intravenous contrast. RADIATION DOSE REDUCTION: This exam was performed according to the departmental dose-optimization program which includes automated exposure control, adjustment of the mA and/or kV according to patient size and/or use of iterative reconstruction technique. CONTRAST:  89mL OMNIPAQUE IOHEXOL 300 MG/ML  SOLN COMPARISON:  MRI and CT 2014.  Chest x-ray 2019 August FINDINGS: CT CHEST FINDINGS Cardiovascular: Trace pericardial fluid. The heart is nonenlarged. Coronary artery calcifications are seen. Calcifications along the aortic valve. The thoracic aorta overall has a normal course and caliber. Mediastinum/Nodes: Preserved thyroid gland. Patulous esophagus. No specific abnormal lymph node enlargement identified in the axillary region, hilum or mediastinum. Lungs/Pleura: There is some linear opacity lung bases likely scar or atelectasis. No consolidation, pneumothorax or effusion. 3 mm right upper lobe nodule on series 4 image 35. Musculoskeletal: Mild degenerative changes along the spine. CT ABDOMEN PELVIS FINDINGS Hepatobiliary: Prior cholecystectomy. Patent portal vein. Benign-appearing hepatic cysts are again identified. Largest is seen left hepatic lobe on series 2 image 44 measuring 3.9 by 4.0 cm. Slightly larger than 2014 but again benign and no specific imaging follow-up. Pancreas: Unremarkable. No pancreatic ductal dilatation or surrounding inflammatory changes. Spleen: Normal in size without focal abnormality. Adrenals/Urinary Tract: The adrenal glands are preserved. Upper pole right-sided benign-appearing renal cysts are identified. Largest measures 14 mm with Hounsfield unit of 10. Bosniak 1 lesions. No specific imaging follow-up. No collecting system dilatation. The ureters are seen extending down to the bladder. Preserved contours of the urinary bladder. Stomach/Bowel: Large bowel is of normal course and caliber with diffuse colonic  stool and extensive colonic diverticulosis. Normal appendix. Stomach  is nondilated. Dilated loops of fluid-filled small bowel in the right lower quadrant with diameter approaching 3.4 cm. There is an abrupt transition as seen on coronal image 66 and 67. Caliber change of 2 adjacent loops. Developing obstruction. With isolated area of please correlate also well for any evidence of an internal hernia. Mild mesenteric stranding. No free air or pneumatosis. Vascular/Lymphatic: Mild scattered vascular calcifications. Normal caliber aorta and IVC. No discrete abnormal lymph node enlargement identified in the abdomen and pelvis. Reproductive: Uterus and bilateral adnexa are unremarkable. Other: No abdominal wall hernia or abnormality. No abdominopelvic ascites. Musculoskeletal: Moderate degenerative changes of the lumbar spine. Listhesis at L3-4. Multilevel disc bulging and stenosis. Degenerative changes also seen of the pelvis. IMPRESSION: Short-segment dilated loop of ileum in the right lower quadrant. There is a caliber change of 2 adjacent small bowel loops with some stranding. Please correlate for developing or partial obstruction. With the overall appearance and internal hernia would be in the differential versus adhesion. Mild adjacent inflammatory stranding but no pneumatosis or free air. Colonic diverticulosis. 3 mm right upper lobe lung nodule No follow-up needed if patient is low-risk.This recommendation follows the consensus statement: Guidelines for Management of Incidental Pulmonary Nodules Detected on CT Images: From the Fleischner Society 2017; Radiology 2017; 284:228-243. Electronically Signed   By: Jill Side M.D.   On: 10/15/2022 13:05    Microbiology Recent Results (from the past 240 hour(s))  Culture, blood (x 2)     Status: None (Preliminary result)   Collection Time: 10/16/2022  9:14 AM   Specimen: BLOOD RIGHT HAND  Result Value Ref Range Status   Specimen Description   Final    BLOOD RIGHT  HAND BOTTLES DRAWN AEROBIC ONLY Performed at [facility] [address] [place], [place] [address]    Special Requests   Final    Blood Culture adequate volume Performed at [facility] [address] [place], [place] [address]    Culture   Final    NO GROWTH 4 DAYS Performed at [facility], [place] [address], [place], [place] [address]    Report Status PENDING  Incomplete  Culture, blood (x 2)     Status: None (Preliminary result)   Collection Time: 10/19/2022  9:14 AM   Specimen: BLOOD RIGHT HAND  Result Value Ref Range Status   Specimen Description   Final    BLOOD RIGHT HAND BOTTLES DRAWN AEROBIC ONLY Performed at [facility] [address], [place], [place] [address]    Special Requests   Final    Blood Culture adequate volume Performed at [facility] [address], [place], [place] [address]    Culture   Final    NO GROWTH 4 DAYS Performed at [facility], [place] [address], [place], [place] [address]    Report Status PENDING  Incomplete  MRSA Next Gen by PCR, Nasal     Status: None   Collection Time: 10/11/2022 10:15 AM   Specimen: Nasal Mucosa; Nasal Swab  Result Value Ref Range Status   MRSA by PCR Next Gen NOT DETECTED NOT DETECTED Final    Comment: (NOTE) The GeneXpert MRSA Assay (FDA approved for NASAL specimens only), is one component of a comprehensive MRSA colonization surveillance program. It is not intended to diagnose MRSA infection nor to guide or monitor treatment for MRSA infections. Test performance is not FDA approved in patients less than 10 years old. Performed at  [facility], [place] [address], [place],  [address]     Lab Basic Metabolic Panel: Recent Labs  Lab 10/26/2022 1120 10/28/22 0422 10/09/2022 0504 10/02/2022 1721  NA 132* 129* 137 132*  K 4.5 3.7 3.6 2.8*  CL 99 95* 104 107  CO2 23 23 16* 17*  GLUCOSE 136* 156* 127* 145*   BUN 19 15 33* 31*  CREATININE 0.91 0.84 2.37* 1.87*  CALCIUM 11.0* 10.1 10.1 6.4*   Liver Function Tests: Recent Labs  Lab 10/10/2022 1120 10/28/22 0422  AST 20 22  ALT 12 15  ALKPHOS 123 119  BILITOT 0.6 1.0  PROT 8.7* 8.1  ALBUMIN 4.6 4.3   Recent Labs  Lab 10/18/2022 1120  LIPASE 70*   No results for input(s): "AMMONIA" in the last 168 hours. CBC: Recent Labs  Lab 10/23/2022 1120 10/15/2022 0504 10/11/2022 1721  WBC 7.1 5.6 1.9*  HGB 14.0 16.7* 12.5  HCT 42.4 52.1* 38.8  MCV 81.9 84.0 84.3  PLT 242 204 166  167   Cardiac Enzymes: No results for input(s): "CKTOTAL", "CKMB", "CKMBINDEX", "TROPONINI" in the last 168 hours. Sepsis Labs: Recent Labs  Lab 10/05/2022 1120 10/08/2022 0504 10/22/2022 0914 10/18/2022 1315 10/13/2022 1548 10/06/2022 1721 10/24/2022 2130  WBC 7.1 5.6  --   --   --  1.9*  --   LATICACIDVEN  --   --  6.2* 3.3* 5.4*  --  >9.0*    Procedures/Operations  Exploratory laparotomy and small bowel resection with wound vac placement.    Rose Fillers Elizabeth Shields 11/03/2022, 7:37 AM
